# Patient Record
Sex: Male | Born: 1975
Health system: Southern US, Community
[De-identification: ages and names within clinical notes are randomized; demographics above are authoritative.]

## PROBLEM LIST (undated history)

## (undated) DIAGNOSIS — K219 Gastro-esophageal reflux disease without esophagitis: Secondary | ICD-10-CM

## (undated) HISTORY — PX: APPENDECTOMY: SHX54

## (undated) HISTORY — DX: Gastro-esophageal reflux disease without esophagitis: K21.9

---

## 2013-07-06 ENCOUNTER — Ambulatory Visit (INDEPENDENT_AMBULATORY_CARE_PROVIDER_SITE_OTHER): Payer: BC Managed Care – PPO | Admitting: Family Medicine

## 2013-07-06 ENCOUNTER — Encounter: Payer: Self-pay | Admitting: Family Medicine

## 2013-07-06 VITALS — BP 128/79 | HR 59 | Temp 98.1°F | Ht 68.0 in | Wt 207.8 lb

## 2013-07-06 DIAGNOSIS — Z139 Encounter for screening, unspecified: Secondary | ICD-10-CM

## 2013-07-06 DIAGNOSIS — Z23 Encounter for immunization: Secondary | ICD-10-CM

## 2013-07-06 DIAGNOSIS — R5383 Other fatigue: Secondary | ICD-10-CM

## 2013-07-06 DIAGNOSIS — Z0000 Encounter for general adult medical examination without abnormal findings: Secondary | ICD-10-CM

## 2013-07-06 DIAGNOSIS — R5381 Other malaise: Secondary | ICD-10-CM

## 2013-07-06 LAB — POCT CBC
Granulocyte percent: 62.7 %G (ref 37–80)
HCT, POC: 47.6 % (ref 43.5–53.7)
Hemoglobin: 15.8 g/dL (ref 14.1–18.1)
Lymph, poc: 2.5 (ref 0.6–3.4)
MCH, POC: 28.7 pg (ref 27–31.2)
MCHC: 33.2 g/dL (ref 31.8–35.4)
MCV: 86.4 fL (ref 80–97)
MPV: 7.7 fL (ref 0–99.8)
POC Granulocyte: 4.5 (ref 2–6.9)
POC LYMPH PERCENT: 34.2 %L (ref 10–50)
Platelet Count, POC: 188 10*3/uL (ref 142–424)
RBC: 5.5 M/uL (ref 4.69–6.13)
RDW, POC: 13 %
WBC: 7.2 10*3/uL (ref 4.6–10.2)

## 2013-07-06 NOTE — Progress Notes (Signed)
   Subjective:    Patient ID: Duane Murphy., male    DOB: 26-Jun-1976, 37 y.o.   MRN: [id number]  HPI  This 37 y.o. male presents for evaluation of CPE.  He has no acute medical concerns.  Review of Systems    No chest pain, SOB, HA, dizziness, vision change, N/V, diarrhea, constipation, dysuria, urinary urgency or frequency, myalgias, arthralgias or rash.  Objective:   Physical Exam Vital signs noted  Well developed well nourished male.  HEENT - Head atraumatic Normocephalic                Eyes - PERRLA, Conjuctiva - clear Sclera- Clear EOMI                Ears - EAC's Wnl TM's Wnl Gross Hearing WNL                Nose - Nares patent                 Throat - oropharanx wnl Respiratory - Lungs CTA bilateral Cardiac - RRR S1 and S2 without murmur GI - Abdomen soft Nontender and bowel sounds active x 4 Extremities - No edema. Neuro - Grossly intact.       Assessment & Plan:  Fatigue - Plan: POCT CBC, Thyroid Panel With TSH, Vit D  25 hydroxy (rtn osteoporosis monitoring)  Screening - Plan: POCT CBC, CMP14+EGFR, Lipid panel, Thyroid Panel With TSH, Vit D  25 hydroxy (rtn osteoporosis monitoring)  Need for prophylactic vaccination and inoculation against influenza  Deatra Canter FNP

## 2013-07-09 ENCOUNTER — Other Ambulatory Visit: Payer: Self-pay | Admitting: Family Medicine

## 2013-07-09 DIAGNOSIS — E785 Hyperlipidemia, unspecified: Secondary | ICD-10-CM

## 2013-07-09 LAB — CMP14+EGFR
ALT: 88 IU/L — ABNORMAL HIGH (ref 0–44)
AST: 50 IU/L — ABNORMAL HIGH (ref 0–40)
Albumin/Globulin Ratio: 1.4 (ref 1.1–2.5)
Albumin: 4.7 g/dL (ref 3.5–5.5)
Alkaline Phosphatase: 91 IU/L (ref 39–117)
BUN/Creatinine Ratio: 15 (ref 8–19)
BUN: 12 mg/dL (ref 6–20)
CO2: 16 mmol/L — ABNORMAL LOW (ref 18–29)
Calcium: 10.2 mg/dL (ref 8.7–10.2)
Chloride: 106 mmol/L (ref 97–108)
Creatinine, Ser: 0.82 mg/dL (ref 0.76–1.27)
GFR calc Af Amer: 132 mL/min/{1.73_m2} (ref >59)
GFR calc non Af Amer: 114 mL/min/{1.73_m2} (ref >59)
Globulin, Total: 3.4 g/dL (ref 1.5–4.5)
Glucose: 98 mg/dL (ref 65–99)
Potassium: 4.6 mmol/L (ref 3.5–5.2)
Sodium: 147 mmol/L — ABNORMAL HIGH (ref 134–144)
Total Bilirubin: 0.3 mg/dL (ref 0.0–1.2)
Total Protein: 8.1 g/dL (ref 6.0–8.5)

## 2013-07-09 LAB — LIPID PANEL
Chol/HDL Ratio: 6.6 ratio units — ABNORMAL HIGH (ref 0.0–5.0)
Cholesterol, Total: 251 mg/dL — ABNORMAL HIGH (ref 100–199)
HDL: 38 mg/dL — ABNORMAL LOW (ref >39)
LDL Calculated: 154 mg/dL — ABNORMAL HIGH (ref 0–99)
Triglycerides: 295 mg/dL — ABNORMAL HIGH (ref 0–149)
VLDL Cholesterol Cal: 59 mg/dL — ABNORMAL HIGH (ref 5–40)

## 2013-07-09 LAB — THYROID PANEL WITH TSH
Free Thyroxine Index: 2.6 (ref 1.2–4.9)
T3 Uptake Ratio: 32 % (ref 24–39)
T4, Total: 8.2 ug/dL (ref 4.5–12.0)
TSH: 1.12 u[IU]/mL (ref 0.450–4.500)

## 2013-07-09 LAB — VITAMIN D 25 HYDROXY (VIT D DEFICIENCY, FRACTURES): Vit D, 25-Hydroxy: 13.2 ng/mL — ABNORMAL LOW (ref 30.0–100.0)

## 2013-07-09 MED ORDER — PRAVASTATIN SODIUM 20 MG PO TABS: 20.0000 mg | ORAL_TABLET | Freq: Every day | ORAL | Status: DC

## 2013-07-09 MED ORDER — VITAMIN D (ERGOCALCIFEROL) 1.25 MG (50000 UNIT) PO CAPS: 50000.0000 [IU] | ORAL_CAPSULE | ORAL | Status: DC

## 2013-07-30 ENCOUNTER — Ambulatory Visit (INDEPENDENT_AMBULATORY_CARE_PROVIDER_SITE_OTHER): Payer: BC Managed Care – PPO | Admitting: Nurse Practitioner

## 2013-07-30 VITALS — BP 134/85 | HR 65 | Temp 98.1°F | Ht 68.0 in | Wt 210.0 lb

## 2013-07-30 DIAGNOSIS — J069 Acute upper respiratory infection, unspecified: Secondary | ICD-10-CM

## 2013-07-30 DIAGNOSIS — J029 Acute pharyngitis, unspecified: Secondary | ICD-10-CM

## 2013-07-30 LAB — POCT RAPID STREP A (OFFICE): RAPID STREP A SCREEN: NEGATIVE

## 2013-07-30 NOTE — Progress Notes (Signed)
   Subjective:    Patient ID: Duane MillerMacario Thomason Jr., male    DOB: 1976-03-15, 38 y.o.   MRN: [id number]  Sore Throat  Associated symptoms include congestion and coughing. Pertinent negatives include no ear pain or trouble swallowing.  Sinus Problem Associated symptoms include congestion, coughing, sinus pressure, sneezing and a sore throat. Pertinent negatives include no chills or ear pain.   Patient in today c/o sore throat- no fever- started with runny nose and cough last week- slight achiness.    Review of Systems  Constitutional: Negative for fever, chills and fatigue.  HENT: Positive for congestion, sinus pressure, sneezing and sore throat. Negative for ear pain and trouble swallowing.   Respiratory: Positive for cough.        Objective:   Physical Exam  Constitutional: He appears well-developed and well-nourished.  HENT:  Right Ear: Hearing, tympanic membrane, external ear and ear canal normal.  Left Ear: Hearing, tympanic membrane, external ear and ear canal normal.  Nose: Mucosal edema and rhinorrhea present. Right sinus exhibits no maxillary sinus tenderness and no frontal sinus tenderness. Left sinus exhibits no maxillary sinus tenderness and no frontal sinus tenderness.  Mouth/Throat: Uvula is midline. Posterior oropharyngeal erythema (mild) present.  Cardiovascular: Normal rate and normal heart sounds.   Pulmonary/Chest: Effort normal and breath sounds normal. No respiratory distress. He has no wheezes. He has no rales.   Results for orders placed in visit on 07/30/13  POCT RAPID STREP A (OFFICE)      Result Value Range   Rapid Strep A Screen Negative  Negative          Assessment & Plan:   1. Acute pharyngitis   2. Acute upper respiratory infections of unspecified site    1. Take meds as prescribed 2. Use a cool mist humidifier especially during the winter months and when heat has  been humid. 3. Use saline nose sprays frequently 4. Saline irrigations of the  nose can be very helpful if done frequently.  * 4X daily for 1 week*  * Use of a nettie pot can be helpful with this. Follow directions with this* 5. Drink plenty of fluids 6. Keep thermostat turn down low 7.For any cough or congestion  Use plain Mucinex- regular strength or max strength is fine   * Children- consult with Pharmacist for dosing 8. For fever or aces or pains- take tylenol or ibuprofen appropriate for age and weight.  * for fevers greater than 101 orally you may alternate ibuprofen and tylenol every  3 hours.   Mary-Margaret Daphine DeutscherMartin, FNP

## 2013-07-30 NOTE — Patient Instructions (Signed)

## 2013-10-29 ENCOUNTER — Encounter: Payer: Self-pay | Admitting: Family Medicine

## 2013-10-29 ENCOUNTER — Ambulatory Visit (INDEPENDENT_AMBULATORY_CARE_PROVIDER_SITE_OTHER): Payer: BC Managed Care – PPO | Admitting: Family Medicine

## 2013-10-29 VITALS — BP 131/83 | HR 121 | Temp 102.6°F | Ht 68.0 in | Wt 213.4 lb

## 2013-10-29 DIAGNOSIS — K529 Noninfective gastroenteritis and colitis, unspecified: Secondary | ICD-10-CM

## 2013-10-29 DIAGNOSIS — R509 Fever, unspecified: Secondary | ICD-10-CM

## 2013-10-29 DIAGNOSIS — K5289 Other specified noninfective gastroenteritis and colitis: Secondary | ICD-10-CM

## 2013-10-29 DIAGNOSIS — R1013 Epigastric pain: Secondary | ICD-10-CM

## 2013-10-29 LAB — POCT CBC
Granulocyte percent: 93.2 %G — AB (ref 37–80)
HCT, POC: 51.6 % (ref 43.5–53.7)
Hemoglobin: 15.9 g/dL (ref 14.1–18.1)
Lymph, poc: 0.5 — AB (ref 0.6–3.4)
MCH, POC: 26.8 pg — AB (ref 27–31.2)
MCHC: 30.9 g/dL — AB (ref 31.8–35.4)
MCV: 86.9 fL (ref 80–97)
MPV: 7.2 fL (ref 0–99.8)
POC Granulocyte: 9.7 — AB (ref 2–6.9)
POC LYMPH PERCENT: 5 %L — AB (ref 10–50)
Platelet Count, POC: 191 10*3/uL (ref 142–424)
RBC: 5.9 M/uL (ref 4.69–6.13)
RDW, POC: 12.7 %
WBC: 10.4 10*3/uL — AB (ref 4.6–10.2)

## 2013-10-29 LAB — POCT INFLUENZA A/B
Influenza A, POC: NEGATIVE
Influenza B, POC: NEGATIVE

## 2013-10-29 MED ORDER — ONDANSETRON HCL 4 MG PO TABS: 4.0000 mg | ORAL_TABLET | Freq: Three times a day (TID) | ORAL | Status: DC | PRN

## 2013-10-29 MED ORDER — OSELTAMIVIR PHOSPHATE 75 MG PO CAPS: 75.0000 mg | ORAL_CAPSULE | Freq: Two times a day (BID) | ORAL | Status: DC

## 2013-10-29 NOTE — Progress Notes (Signed)
Patient ID: Duane Murphy., male   DOB: 24-Dec-1975, 38 y.o.   MRN: [id number] SUBJECTIVE: CC: Chief Complaint  Patient presents with  . Acute Visit    fever aching nauseous no vomiting     HPI: Has had epigastric pain nausea and diarrhea since yesterday. Body aches terrible. Started after meals yesterday. hasn"t vomited but terribly sick to the stomach. Wife was sick couple of weeks ago with diarrhea. And vomiting , no one else at home sick.    Past Medical History  Diagnosis Date  . GERD (gastroesophageal reflux disease)    Past Surgical History  Procedure Laterality Date  . Appendectomy     History   Social History  . Marital Status: Married    Spouse Name: N/A    Number of Children: N/A  . Years of Education: N/A   Occupational History  . Not on file.   Social History Main Topics  . Smoking status: Former Smoker -- 0.25 packs/day    Types: Cigarettes    Start date: 08/04/1997    Quit date: 07/07/2011  . Smokeless tobacco: Not on file  . Alcohol Use: Yes     Comment: rare  . Drug Use: No  . Sexual Activity: Not on file   Other Topics Concern  . Not on file   Social History Narrative  . No narrative on file   Family History  Problem Relation Age of Onset  . Diabetes Mother   . Hyperlipidemia Mother   . Hypertension Mother   . Diabetes Father   . Hyperlipidemia Father   . Hypertension Father    Current Outpatient Prescriptions on File Prior to Visit  Medication Sig Dispense Refill  . pravastatin (PRAVACHOL) 20 MG tablet Take 1 tablet (20 mg total) by mouth daily.  90 tablet  3  . Vitamin D, Ergocalciferol, (DRISDOL) 50000 UNITS CAPS capsule Take 1 capsule (50,000 Units total) by mouth every 7 (seven) days.  18 capsule  0   No current facility-administered medications on file prior to visit.   No Known Allergies Immunization History  Administered Date(s) Administered  . Influenza,inj,Quad PF,36+ Mos 07/06/2013   Prior to Admission medications    Medication Sig Start Date End Date Taking? Authorizing Provider  pravastatin (PRAVACHOL) 20 MG tablet Take 1 tablet (20 mg total) by mouth daily. 07/09/13   Lysbeth Penner, FNP  Vitamin D, Ergocalciferol, (DRISDOL) 50000 UNITS CAPS capsule Take 1 capsule (50,000 Units total) by mouth every 7 (seven) days. 07/09/13   Lysbeth Penner, FNP     ROS: As above in the HPI. All other systems are stable or negative.  OBJECTIVE: APPEARANCE:  Patient in no acute distress.The patient appeared well nourished and normally developed. Acyanotic. Waist: VITAL SIGNS:BP 131/83  Pulse 121  Temp(Src) 102.6 F (39.2 C) (Oral)  Ht '5\' 8"'  (1.727 m)  Wt 213 lb 6.4 oz (96.798 kg)  BMI 32.46 kg/m2 Febrile Hispanic male.   SKIN: warm and  Dry without overt rashes, tattoos and scars  HEAD and Neck: without JVD, Head and scalp: normal Eyes:No scleral icterus. Fundi normal, eye movements normal. Ears: Auricle normal, canal normal, Tympanic membranes normal, insufflation normal. Nose: normal Throat: normal Neck & thyroid: normal  CHEST & LUNGS: Chest wall: normal Lungs: Clear  CVS: Reveals the PMI to be normally located. Regular rhythm, First and Second Heart sounds are normal,  absence of murmurs, rubs or gallops. Peripheral vasculature: Radial pulses: normal Dorsal pedis pulses: normal Posterior pulses: normal  ABDOMEN:  Appearance:soft epigastric tenderness. Appendectomy scar. bs increasedBenign, no organomegaly, no masses, no Abdominal Aortic enlargement. No Guarding , no rebound. No Bruits. Bowel sounds: increased  RECTAL: N/A GU: N/A  EXTREMETIES: nonedematous.  MUSCULOSKELETAL:  Spine: normal Joints: intact  NEUROLOGIC: oriented to time,place and person; nonfocal. Strength is normal Sensory is normal Reflexes are normal Cranial Nerves are normal.  ASSESSMENT: Fever - Plan: POCT Influenza A/B, POCT CBC, oseltamivir (TAMIFLU) 75 MG capsule  Abdominal pain, epigastric  - Plan: Amylase, Lipase, CMP14+EGFR  Gastroenteritis - Plan: ondansetron (ZOFRAN) 4 MG tablet  PLAN: If acutely worse to the ED  Use OTC imodium-AD  Handout on GE.   Orders Placed This Encounter  Procedures  . Amylase  . Lipase  . CMP14+EGFR  . POCT Influenza A/B  . POCT CBC    Meds ordered this encounter  Medications  . oseltamivir (TAMIFLU) 75 MG capsule    Sig: Take 1 capsule (75 mg total) by mouth 2 (two) times daily.    Dispense:  10 capsule    Refill:  0  . ondansetron (ZOFRAN) 4 MG tablet    Sig: Take 1 tablet (4 mg total) by mouth every 8 (eight) hours as needed for nausea or vomiting.    Dispense:  8 tablet    Refill:  0   Results for orders placed in visit on 10/29/13  POCT INFLUENZA A/B      Result Value Ref Range   Influenza A, POC Negative     Influenza B, POC Negative    POCT CBC      Result Value Ref Range   WBC 10.4 (*) 4.6 - 10.2 K/uL   Lymph, poc 0.5 (*) 0.6 - 3.4   POC LYMPH PERCENT 5.0 (*) 10 - 50 %L   POC Granulocyte 9.7 (*) 2 - 6.9   Granulocyte percent 93.2 (*) 37 - 80 %G   RBC 5.9  4.69 - 6.13 M/uL   Hemoglobin 15.9  14.1 - 18.1 g/dL   HCT, POC 51.6  43.5 - 53.7 %   MCV 86.9  80 - 97 fL   MCH, POC 26.8 (*) 27 - 31.2 pg   MCHC 30.9 (*) 31.8 - 35.4 g/dL   RDW, POC 12.7     Platelet Count, POC 191.0  142 - 424 K/uL   MPV 7.2  0 - 99.8 fL    There are no discontinued medications. Return in about 1 day (around 10/30/2013) for Recheck medical problems.  Marvis Saefong P. Jacelyn Grip, M.D.

## 2013-10-29 NOTE — Patient Instructions (Signed)
USE IMODIUM -AD. Over the counter.     Viral Gastroenteritis Viral gastroenteritis is also known as stomach flu. This condition affects the stomach and intestinal tract. It can cause sudden diarrhea and vomiting. The illness typically lasts 3 to 8 days. Most people develop an immune response that eventually gets rid of the virus. While this natural response develops, the virus can make you quite ill. CAUSES  Many different viruses can cause gastroenteritis, such as rotavirus or noroviruses. You can catch one of these viruses by consuming contaminated food or water. You may also catch a virus by sharing utensils or other personal items with an infected person or by touching a contaminated surface. SYMPTOMS  The most common symptoms are diarrhea and vomiting. These problems can cause a severe loss of body fluids (dehydration) and a body salt (electrolyte) imbalance. Other symptoms may include:  Fever.  Headache.  Fatigue.  Abdominal pain. DIAGNOSIS  Your caregiver can usually diagnose viral gastroenteritis based on your symptoms and a physical exam. A stool sample may also be taken to test for the presence of viruses or other infections. TREATMENT  This illness typically goes away on its own. Treatments are aimed at rehydration. The most serious cases of viral gastroenteritis involve vomiting so severely that you are not able to keep fluids down. In these cases, fluids must be given through an intravenous line (IV). HOME CARE INSTRUCTIONS   Drink enough fluids to keep your urine clear or pale yellow. Drink small amounts of fluids frequently and increase the amounts as tolerated.  Ask your caregiver for specific rehydration instructions.  Avoid:  Foods high in sugar.  Alcohol.  Carbonated drinks.  Tobacco.  Juice.  Caffeine drinks.  Extremely hot or cold fluids.  Fatty, greasy foods.  Too much intake of anything at one time.  Dairy products until 24 to 48 hours after  diarrhea stops.  You may consume probiotics. Probiotics are active cultures of beneficial bacteria. They may lessen the amount and number of diarrheal stools in adults. Probiotics can be found in yogurt with active cultures and in supplements.  Wash your hands well to avoid spreading the virus.  Only take over-the-counter or prescription medicines for pain, discomfort, or fever as directed by your caregiver. Do not give aspirin to children. Antidiarrheal medicines are not recommended.  Ask your caregiver if you should continue to take your regular prescribed and over-the-counter medicines.  Keep all follow-up appointments as directed by your caregiver. SEEK IMMEDIATE MEDICAL CARE IF:   You are unable to keep fluids down.  You do not urinate at least once every 6 to 8 hours.  You develop shortness of breath.  You notice blood in your stool or vomit. This may look like coffee grounds.  You have abdominal pain that increases or is concentrated in one small area (localized).  You have persistent vomiting or diarrhea.  You have a fever.  The patient is a child younger than 3 months, and he or she has a fever.  The patient is a child older than 3 months, and he or she has a fever and persistent symptoms.  The patient is a child older than 3 months, and he or she has a fever and symptoms suddenly get worse.  The patient is a baby, and he or she has no tears when crying. MAKE SURE YOU:   Understand these instructions.  Will watch your condition.  Will get help right away if you are not doing well or get  worse. Document Released: 07/12/2005 Document Revised: 10/04/2011 Document Reviewed: 04/28/2011 [facility] Patient Information 2014 [place], [place].

## 2013-10-30 ENCOUNTER — Ambulatory Visit (INDEPENDENT_AMBULATORY_CARE_PROVIDER_SITE_OTHER): Payer: BC Managed Care – PPO | Admitting: Family Medicine

## 2013-10-30 ENCOUNTER — Encounter: Payer: Self-pay | Admitting: Family Medicine

## 2013-10-30 VITALS — BP 123/81 | HR 85 | Temp 99.5°F | Ht 68.0 in | Wt 212.8 lb

## 2013-10-30 DIAGNOSIS — R6889 Other general symptoms and signs: Secondary | ICD-10-CM

## 2013-10-30 DIAGNOSIS — R899 Unspecified abnormal finding in specimens from other organs, systems and tissues: Secondary | ICD-10-CM

## 2013-10-30 DIAGNOSIS — R1013 Epigastric pain: Secondary | ICD-10-CM

## 2013-10-30 LAB — CMP14+EGFR
ALT: 107 IU/L — ABNORMAL HIGH (ref 0–44)
AST: 56 IU/L — ABNORMAL HIGH (ref 0–40)
Albumin/Globulin Ratio: 1.6 (ref 1.1–2.5)
Albumin: 4.4 g/dL (ref 3.5–5.5)
Alkaline Phosphatase: 98 IU/L (ref 39–117)
BUN/Creatinine Ratio: 14 (ref 8–19)
BUN: 11 mg/dL (ref 6–20)
CO2: 21 mmol/L (ref 18–29)
Calcium: 9.2 mg/dL (ref 8.7–10.2)
Chloride: 96 mmol/L — ABNORMAL LOW (ref 97–108)
Creatinine, Ser: 0.8 mg/dL (ref 0.76–1.27)
GFR calc Af Amer: 132 mL/min/{1.73_m2} (ref >59)
GFR calc non Af Amer: 114 mL/min/{1.73_m2} (ref >59)
Globulin, Total: 2.8 g/dL (ref 1.5–4.5)
Glucose: 110 mg/dL — ABNORMAL HIGH (ref 65–99)
Potassium: 3.8 mmol/L (ref 3.5–5.2)
Sodium: 137 mmol/L (ref 134–144)
Total Bilirubin: 0.5 mg/dL (ref 0.0–1.2)
Total Protein: 7.2 g/dL (ref 6.0–8.5)

## 2013-10-30 LAB — AMYLASE: Amylase: 47 U/L (ref 31–124)

## 2013-10-30 LAB — LIPASE: Lipase: 18 U/L (ref 0–59)

## 2013-10-30 NOTE — Patient Instructions (Signed)
      Dr Xxavier Noon's Recommendations  For nutrition information, I recommend books:  1).Eat to Live by Dr Joel Fuhrman. 2).Prevent and Reverse Heart Disease by Dr Caldwell Esselstyn. 3) Dr Neal Barnard's Book:  Program to Reverse Diabetes  Exercise recommendations are:  If unable to walk, then the patient can exercise in a chair 3 times a day. By flapping arms like a bird gently and raising legs outwards to the front.  If ambulatory, the patient can go for walks for 30 minutes 3 times a week. Then increase the intensity and duration as tolerated.  Goal is to try to attain exercise frequency to 5 times a week.  If applicable: Best to perform resistance exercises (machines or weights) 2 days a week and cardio type exercises 3 days per week.  

## 2013-10-30 NOTE — Progress Notes (Signed)
Patient ID: Duane Murphy., male   DOB: 1976/04/07, 38 y.o.   MRN: [id number] SUBJECTIVE: CC: Chief Complaint  Patient presents with  . Follow-up    1 day follow up feels some better     HPI: Here for follow up of flu like illness with diarrhea and vomiting and fever. However he has had ntermittent episodes prior to this and elevated liver transaminases. He is somewhat better today. Chills has stopped. Fever is less and he has eaten today.  Past Medical History  Diagnosis Date  . GERD (gastroesophageal reflux disease)    Past Surgical History  Procedure Laterality Date  . Appendectomy     History   Social History  . Marital Status: Married    Spouse Name: N/A    Number of Children: N/A  . Years of Education: N/A   Occupational History  . Not on file.   Social History Main Topics  . Smoking status: Former Smoker -- 0.25 packs/day    Types: Cigarettes    Start date: 08/04/1997    Quit date: 07/07/2011  . Smokeless tobacco: Not on file  . Alcohol Use: Yes     Comment: rare  . Drug Use: No  . Sexual Activity: Not on file   Other Topics Concern  . Not on file   Social History Narrative  . No narrative on file   Family History  Problem Relation Age of Onset  . Diabetes Mother   . Hyperlipidemia Mother   . Hypertension Mother   . Diabetes Father   . Hyperlipidemia Father   . Hypertension Father    Current Outpatient Prescriptions on File Prior to Visit  Medication Sig Dispense Refill  . ondansetron (ZOFRAN) 4 MG tablet Take 1 tablet (4 mg total) by mouth every 8 (eight) hours as needed for nausea or vomiting.  8 tablet  0  . oseltamivir (TAMIFLU) 75 MG capsule Take 1 capsule (75 mg total) by mouth 2 (two) times daily.  10 capsule  0  . pravastatin (PRAVACHOL) 20 MG tablet Take 1 tablet (20 mg total) by mouth daily.  90 tablet  3  . Vitamin D, Ergocalciferol, (DRISDOL) 50000 UNITS CAPS capsule Take 1 capsule (50,000 Units total) by mouth every 7 (seven)  days.  18 capsule  0   No current facility-administered medications on file prior to visit.   No Known Allergies Immunization History  Administered Date(s) Administered  . Influenza,inj,Quad PF,36+ Mos 07/06/2013   Prior to Admission medications   Medication Sig Start Date End Date Taking? Authorizing Provider  ondansetron (ZOFRAN) 4 MG tablet Take 1 tablet (4 mg total) by mouth every 8 (eight) hours as needed for nausea or vomiting. 10/29/13  Yes Vernie Shanks, MD  oseltamivir (TAMIFLU) 75 MG capsule Take 1 capsule (75 mg total) by mouth 2 (two) times daily. 10/29/13   Vernie Shanks, MD  pravastatin (PRAVACHOL) 20 MG tablet Take 1 tablet (20 mg total) by mouth daily. 07/09/13   Lysbeth Penner, FNP  Vitamin D, Ergocalciferol, (DRISDOL) 50000 UNITS CAPS capsule Take 1 capsule (50,000 Units total) by mouth every 7 (seven) days. 07/09/13   Lysbeth Penner, FNP     ROS: As above in the HPI. All other systems are stable or negative.  OBJECTIVE: APPEARANCE:  Patient in no acute distress.The patient appeared well nourished and normally developed. Acyanotic. Waist: VITAL SIGNS:BP 123/81  Pulse 85  Temp(Src) 99.5 F (37.5 C) (Oral)  Ht '5\' 8"'  (1.727 m)  Wt 212 lb 12.8 oz (96.525 kg)  BMI 32.36 kg/m2 hispanic Male  SKIN: warm and  Dry without overt rashes, tattoos and scars  HEAD and Neck: without JVD, Head and scalp: normal Eyes:No scleral icterus. Fundi normal, eye movements normal. Ears: Auricle normal, canal normal, Tympanic membranes normal, insufflation normal. Nose: normal Throat: normal Neck & thyroid: normal  CHEST & LUNGS: Chest wall: normal Lungs: Clear  CVS: Reveals the PMI to be normally located. Regular rhythm, First and Second Heart sounds are normal,  absence of murmurs, rubs or gallops. Peripheral vasculature: Radial pulses: normal Dorsal pedis pulses: normal Posterior pulses: normal  ABDOMEN:  Appearance: epigastric tenderness and bloating per  patientmild tenderness to palpation. Benign, no organomegaly, no masses, no Abdominal Aortic enlargement. No Guarding , no rebound. No Bruits. Bowel sounds: normal  RECTAL: N/A GU: N/A  EXTREMETIES: nonedematous.  MUSCULOSKELETAL:  Spine: normal Joints: intact  NEUROLOGIC: oriented to time,place and person; nonfocal. Strength is normal Sensory is normal Reflexes are normal Cranial Nerves are normal. Results for orders placed in visit on 10/29/13  AMYLASE      Result Value Ref Range   Amylase 47  31 - 124 U/L  LIPASE      Result Value Ref Range   Lipase 18  0 - 59 U/L  CMP14+EGFR      Result Value Ref Range   Glucose 110 (*) 65 - 99 mg/dL   BUN 11  6 - 20 mg/dL   Creatinine, Ser 0.80  0.76 - 1.27 mg/dL   GFR calc non Af Amer 114  >59 mL/min/1.73   GFR calc Af Amer 132  >59 mL/min/1.73   BUN/Creatinine Ratio 14  8 - 19   Sodium 137  134 - 144 mmol/L   Potassium 3.8  3.5 - 5.2 mmol/L   Chloride 96 (*) 97 - 108 mmol/L   CO2 21  18 - 29 mmol/L   Calcium 9.2  8.7 - 10.2 mg/dL   Total Protein 7.2  6.0 - 8.5 g/dL   Albumin 4.4  3.5 - 5.5 g/dL   Globulin, Total 2.8  1.5 - 4.5 g/dL   Albumin/Globulin Ratio 1.6  1.1 - 2.5   Total Bilirubin 0.5  0.0 - 1.2 mg/dL   Alkaline Phosphatase 98  39 - 117 IU/L   AST 56 (*) 0 - 40 IU/L   ALT 107 (*) 0 - 44 IU/L  POCT INFLUENZA A/B      Result Value Ref Range   Influenza A, POC Negative     Influenza B, POC Negative    POCT CBC      Result Value Ref Range   WBC 10.4 (*) 4.6 - 10.2 K/uL   Lymph, poc 0.5 (*) 0.6 - 3.4   POC LYMPH PERCENT 5.0 (*) 10 - 50 %L   POC Granulocyte 9.7 (*) 2 - 6.9   Granulocyte percent 93.2 (*) 37 - 80 %G   RBC 5.9  4.69 - 6.13 M/uL   Hemoglobin 15.9  14.1 - 18.1 g/dL   HCT, POC 51.6  43.5 - 53.7 %   MCV 86.9  80 - 97 fL   MCH, POC 26.8 (*) 27 - 31.2 pg   MCHC 30.9 (*) 31.8 - 35.4 g/dL   RDW, POC 12.7     Platelet Count, POC 191.0  142 - 424 K/uL   MPV 7.2  0 - 99.8 fL   ASSESSMENT: Abdominal  pain, epigastric - Plan: US Abdomen Limited RUQ  Abnormal laboratory test - Plan: US Abdomen Limited RUQ Elevated transaminases  PLAN:  Orders Placed This Encounter  Procedures  . US Abdomen Limited RUQ    JH/DEBBIE    BC[employer]    REQ 04/08    TIME OK PER RICH    Standing Status: Future     Number of Occurrences:      Standing Expiration Date: 12/31/2014    Order Specific Question:  Reason for Exam (SYMPTOM  OR DIAGNOSIS REQUIRED)    Answer:  epigastric pain, elevated transaminases    Order Specific Question:  Preferred imaging location?    Answer:  [facility]  continue present regimen. Low fat diet. Await U/S result. Follow up pending labs and U/S. No orders of the defined types were placed in this encounter.   There are no discontinued medications. Return if symptoms worsen or fail to improve.  Revonda Menter P. Jacelyn Grip, M.D.

## 2013-10-31 ENCOUNTER — Ambulatory Visit (HOSPITAL_COMMUNITY)
Admission: RE | Admit: 2013-10-31 | Discharge: 2013-10-31 | Disposition: A | Discharge status: Home / Self Care | Payer: BC Managed Care – PPO | Source: Ambulatory Visit | Attending: Family Medicine | Admitting: Family Medicine

## 2013-10-31 DIAGNOSIS — R899 Unspecified abnormal finding in specimens from other organs, systems and tissues: Secondary | ICD-10-CM

## 2013-10-31 DIAGNOSIS — K7689 Other specified diseases of liver: Secondary | ICD-10-CM | POA: Insufficient documentation

## 2013-10-31 DIAGNOSIS — R109 Unspecified abdominal pain: Secondary | ICD-10-CM | POA: Insufficient documentation

## 2013-10-31 DIAGNOSIS — R1013 Epigastric pain: Secondary | ICD-10-CM

## 2013-10-31 NOTE — Progress Notes (Signed)
Quick Note:  Call patient. GB U/S normal. No change in plan. ______

## 2013-11-01 ENCOUNTER — Telehealth: Payer: Self-pay

## 2013-11-01 NOTE — Telephone Encounter (Signed)
SPOKE TO WIFE --SHE IS AWARE OF ULTRASOUND REPORT AND THEY ARE REQUESTING REFERRAL TO GI SPECIALIST AND ALSO WANTS TO KNOW IF AND WHEN SHOULD START HIS CHOLESTEROL MEDS. Duane Murphy.?

## 2013-11-02 NOTE — Telephone Encounter (Signed)
Patients wife aware will call back at another time to make appointment when they find a good time.

## 2013-11-02 NOTE — Telephone Encounter (Signed)
Follow up in 1 week to recheck the liver enzymes and then we can also do the referral at that time.

## 2013-11-14 ENCOUNTER — Other Ambulatory Visit (INDEPENDENT_AMBULATORY_CARE_PROVIDER_SITE_OTHER): Payer: BC Managed Care – PPO

## 2013-11-14 DIAGNOSIS — R7989 Other specified abnormal findings of blood chemistry: Secondary | ICD-10-CM

## 2013-11-14 DIAGNOSIS — E785 Hyperlipidemia, unspecified: Secondary | ICD-10-CM

## 2013-11-14 NOTE — Progress Notes (Signed)
Patient came in for labs only.

## 2013-11-15 LAB — HEPATIC FUNCTION PANEL
ALT: 67 IU/L — ABNORMAL HIGH (ref 0–44)
AST: 34 IU/L (ref 0–40)
Albumin: 4.7 g/dL (ref 3.5–5.5)
Alkaline Phosphatase: 78 IU/L (ref 39–117)
Bilirubin, Direct: 0.12 mg/dL (ref 0.00–0.40)
Total Bilirubin: 0.4 mg/dL (ref 0.0–1.2)
Total Protein: 7.4 g/dL (ref 6.0–8.5)

## 2013-11-15 LAB — LIPID PANEL
Chol/HDL Ratio: 7.1 ratio units — ABNORMAL HIGH (ref 0.0–5.0)
Cholesterol, Total: 221 mg/dL — ABNORMAL HIGH (ref 100–199)
HDL: 31 mg/dL — ABNORMAL LOW (ref >39)
LDL Calculated: 133 mg/dL — ABNORMAL HIGH (ref 0–99)
Triglycerides: 283 mg/dL — ABNORMAL HIGH (ref 0–149)
VLDL Cholesterol Cal: 57 mg/dL — ABNORMAL HIGH (ref 5–40)

## 2013-11-18 ENCOUNTER — Other Ambulatory Visit: Payer: Self-pay | Admitting: Family Medicine

## 2013-11-18 DIAGNOSIS — E785 Hyperlipidemia, unspecified: Secondary | ICD-10-CM

## 2013-11-18 MED ORDER — ATORVASTATIN CALCIUM 20 MG PO TABS: 20.0000 mg | ORAL_TABLET | Freq: Every day | ORAL | Status: DC

## 2013-11-18 NOTE — Progress Notes (Signed)
Quick Note:  Call Patient Labs are abnormal:but a little better: The liver enzymes are better. One is still high The lipids are a little better but still high.  Recommendations: I will switch from pravastatin to atorvastatin because he may get a better response. Follow up with a provider in 3 months. Needs to be on a Plant based Diet.   ______

## 2014-01-01 ENCOUNTER — Ambulatory Visit (INDEPENDENT_AMBULATORY_CARE_PROVIDER_SITE_OTHER): Payer: BC Managed Care – PPO | Admitting: Family Medicine

## 2014-01-01 ENCOUNTER — Ambulatory Visit (INDEPENDENT_AMBULATORY_CARE_PROVIDER_SITE_OTHER): Payer: BC Managed Care – PPO

## 2014-01-01 ENCOUNTER — Encounter: Payer: Self-pay | Admitting: Family Medicine

## 2014-01-01 VITALS — BP 134/72 | HR 111 | Temp 101.6°F | Ht 69.0 in | Wt 211.0 lb

## 2014-01-01 DIAGNOSIS — K529 Noninfective gastroenteritis and colitis, unspecified: Secondary | ICD-10-CM

## 2014-01-01 DIAGNOSIS — K5289 Other specified noninfective gastroenteritis and colitis: Secondary | ICD-10-CM

## 2014-01-01 DIAGNOSIS — R197 Diarrhea, unspecified: Secondary | ICD-10-CM

## 2014-01-01 DIAGNOSIS — R509 Fever, unspecified: Secondary | ICD-10-CM

## 2014-01-01 DIAGNOSIS — M2550 Pain in unspecified joint: Secondary | ICD-10-CM

## 2014-01-01 DIAGNOSIS — R110 Nausea: Secondary | ICD-10-CM

## 2014-01-01 LAB — POCT CBC
Granulocyte percent: 91.6 %G — AB (ref 37–80)
HCT, POC: 47.6 % (ref 43.5–53.7)
Hemoglobin: 15.2 g/dL (ref 14.1–18.1)
Lymph, poc: 0.8 (ref 0.6–3.4)
MCH, POC: 28 pg (ref 27–31.2)
MCHC: 32 g/dL (ref 31.8–35.4)
MCV: 87.6 fL (ref 80–97)
MPV: 8 fL (ref 0–99.8)
POC Granulocyte: 9.7 — AB (ref 2–6.9)
POC LYMPH PERCENT: 7.2 %L — AB (ref 10–50)
Platelet Count, POC: 159 10*3/uL (ref 142–424)
RBC: 5.4 M/uL (ref 4.69–6.13)
RDW, POC: 13.2 %
WBC: 10.6 10*3/uL — AB (ref 4.6–10.2)

## 2014-01-01 MED ORDER — ONDANSETRON 8 MG PO TBDP: 8.0000 mg | ORAL_TABLET | Freq: Three times a day (TID) | ORAL | Status: DC | PRN

## 2014-01-01 NOTE — Progress Notes (Signed)
   Subjective:    Patient ID: [name]., male    DOB: 08-Mar-1976, 38 y.o.   MRN: [id number]  HPI This 38 y.o. male presents for evaluation of nausea, malaise, diarrhea, and fever.  He states he had similar sx's a couple of months ago and he thinks there is something that is causing this and he wants to have tests to look for possible causes for his N/V/D.  He took the zofran $RemoveBe'4mg'qsdEuhKxy$  tabs and they did not help.   Review of Systems C/o fever, malaise, N/V/D   No chest pain, SOB, HA, dizziness, vision change, constipation, dysuria, urinary urgency or frequency, myalgias, arthralgias or rash.  Objective:   Physical Exam  Vital signs noted  Well developed well nourished male.  HEENT - Head atraumatic Normocephalic                Eyes - PERRLA, Conjuctiva - clear Sclera- Clear EOMI                Ears - EAC's Wnl TM's Wnl Gross Hearing WNL                 Throat - oropharanx wnl Respiratory - Lungs CTA bilateral Cardiac - RRR S1 and S2 without murmur GI - Abdomen soft Nontender and bowel sounds active x 4 Extremities - No edema. Neuro - Grossly intact.  Results for orders placed in visit on 01/01/14  POCT CBC      Result Value Ref Range   WBC 10.6 (*) 4.6 - 10.2 K/uL   Lymph, poc 0.8  0.6 - 3.4   POC LYMPH PERCENT 7.2 (*) 10 - 50 %L   POC Granulocyte 9.7 (*) 2 - 6.9   Granulocyte percent 91.6 (*) 37 - 80 %G   RBC 5.4  4.69 - 6.13 M/uL   Hemoglobin 15.2  14.1 - 18.1 g/dL   HCT, POC 47.6  43.5 - 53.7 %   MCV 87.6  80 - 97 fL   MCH, POC 28.0  27 - 31.2 pg   MCHC 32.0  31.8 - 35.4 g/dL   RDW, POC 13.2     Platelet Count, POC 159.0  142 - 424 K/uL   MPV 8.0  0 - 99.8 fL   KUB - Normal bowel gas pattern Prelimnary reading by Gwyndolyn Saxon Emy Angevine,FNP    Assessment & Plan:  Gastroenteritis - Plan: ondansetron (ZOFRAN ODT) 8 MG disintegrating tablet, POCT CBC, CMP14+EGFR, Sedimentation rate, ANA, Lipase, DG Abd 1 View, Rocky mtn spotted fvr abs pnl(IgG+IgM), Lyme Ab/Western Blot  Reflex  Nausea alone - Plan: ondansetron (ZOFRAN ODT) 8 MG disintegrating tablet, POCT CBC, CMP14+EGFR, Sedimentation rate, ANA, Lipase, DG Abd 1 View, Rocky mtn spotted fvr abs pnl(IgG+IgM), Lyme Ab/Western Blot Reflex  Diarrhea - Plan: ondansetron (ZOFRAN ODT) 8 MG disintegrating tablet, POCT CBC, CMP14+EGFR, Sedimentation rate, ANA, Lipase, DG Abd 1 View, Rocky mtn spotted fvr abs pnl(IgG+IgM), Lyme Ab/Western Blot Reflex  Joint pain - Plan: ondansetron (ZOFRAN ODT) 8 MG disintegrating tablet, POCT CBC, CMP14+EGFR, Sedimentation rate, ANA, Lipase, DG Abd 1 View, Rocky mtn spotted fvr abs pnl(IgG+IgM), Lyme Ab/Western Blot Reflex  Fever, unspecified - Plan: ondansetron (ZOFRAN ODT) 8 MG disintegrating tablet, POCT CBC, CMP14+EGFR, Sedimentation rate, ANA, Lipase, DG Abd 1 View, Rocky mtn spotted fvr abs pnl(IgG+IgM), Lyme Ab/Western Blot Reflex  Follow up in one week.  Lysbeth Penner FNP

## 2014-01-03 LAB — CMP14+EGFR
ALT: 69 IU/L — ABNORMAL HIGH (ref 0–44)
AST: 32 IU/L (ref 0–40)
Albumin/Globulin Ratio: 1.6 (ref 1.1–2.5)
Albumin: 4.4 g/dL (ref 3.5–5.5)
Alkaline Phosphatase: 91 IU/L (ref 39–117)
BUN/Creatinine Ratio: 12 (ref 8–19)
BUN: 11 mg/dL (ref 6–20)
CO2: 24 mmol/L (ref 18–29)
Calcium: 8.9 mg/dL (ref 8.7–10.2)
Chloride: 96 mmol/L — ABNORMAL LOW (ref 97–108)
Creatinine, Ser: 0.9 mg/dL (ref 0.76–1.27)
GFR calc Af Amer: 126 mL/min/{1.73_m2} (ref >59)
GFR calc non Af Amer: 109 mL/min/{1.73_m2} (ref >59)
Globulin, Total: 2.7 g/dL (ref 1.5–4.5)
Glucose: 120 mg/dL — ABNORMAL HIGH (ref 65–99)
Potassium: 4.1 mmol/L (ref 3.5–5.2)
Sodium: 138 mmol/L (ref 134–144)
Total Bilirubin: 0.6 mg/dL (ref 0.0–1.2)
Total Protein: 7.1 g/dL (ref 6.0–8.5)

## 2014-01-03 LAB — LIPASE: Lipase: 19 U/L (ref 0–59)

## 2014-01-03 LAB — SEDIMENTATION RATE: Sed Rate: 6 mm/hr (ref 0–15)

## 2014-01-03 LAB — ROCKY MTN SPOTTED FVR ABS PNL(IGG+IGM)
RMSF IgG: NEGATIVE
RMSF IgM: 0.13 index (ref 0.00–0.89)

## 2014-01-03 LAB — ANA: Anti Nuclear Antibody(ANA): NEGATIVE

## 2014-01-03 LAB — LYME AB/WESTERN BLOT REFLEX
LYME DISEASE AB, QUANT, IGM: <0.8 index (ref 0.00–0.79)
Lyme IgG/IgM Ab: <0.91 {ISR} (ref 0.00–0.90)

## 2014-01-07 ENCOUNTER — Telehealth: Payer: Self-pay | Admitting: Family Medicine

## 2014-01-07 NOTE — Telephone Encounter (Signed)
Message copied by Azalee CourseFULP, ASHLEY on Mon Jan 07, 2014  9:29 AM ------      Message from: Deatra CanterXFORD, WILLIAM J      Created: Fri Jan 04, 2014  6:15 PM       lymes and rmsf are negative and arthritis panel is negative, glucose in non diabetic range but elevated. ------

## 2014-04-17 ENCOUNTER — Telehealth: Payer: Self-pay | Admitting: Family Medicine

## 2014-04-17 NOTE — Telephone Encounter (Signed)
Appt given for tomorrow

## 2014-04-18 ENCOUNTER — Ambulatory Visit (INDEPENDENT_AMBULATORY_CARE_PROVIDER_SITE_OTHER): Payer: BC Managed Care – PPO | Admitting: Family Medicine

## 2014-04-18 ENCOUNTER — Encounter: Payer: Self-pay | Admitting: Family Medicine

## 2014-04-18 VITALS — BP 120/78 | HR 68 | Temp 98.1°F | Ht 69.0 in | Wt 212.0 lb

## 2014-04-18 DIAGNOSIS — M25529 Pain in unspecified elbow: Secondary | ICD-10-CM

## 2014-04-18 DIAGNOSIS — E785 Hyperlipidemia, unspecified: Secondary | ICD-10-CM

## 2014-04-18 MED ORDER — NAPROXEN 500 MG PO TABS: 500.0000 mg | ORAL_TABLET | Freq: Two times a day (BID) | ORAL | Status: DC

## 2014-04-18 MED ORDER — ATORVASTATIN CALCIUM 20 MG PO TABS: 20.0000 mg | ORAL_TABLET | Freq: Every day | ORAL | Status: DC

## 2014-04-18 NOTE — Progress Notes (Signed)
   Subjective:    Patient ID: Duane Murphy., male    DOB: 10/09/1975, 38 y.o.   MRN: [id number]  HPI This 38 y.o. male presents for evaluation of elbow pain.  He needs refill on lipitor.   Review of Systems No chest pain, SOB, HA, dizziness, vision change, N/V, diarrhea, constipation, dysuria, urinary urgency or frequency, myalgias, arthralgias or rash.     Objective:   Physical Exam Vital signs noted  Well developed well nourished male.  HEENT - Head atraumatic Normocephalic Respiratory - Lungs CTA bilateral Cardiac - RRR S1 and S2 without murmur GI - Abdomen soft Nontender and bowel sounds active x 4 Extremities - No edema. Neuro - Grossly intact. MS - Left lateral epicondyle tender      Assessment & Plan:  Elbow pain, unspecified laterality - Plan: naproxen (NAPROSYN) 500 MG tablet  HLD (hyperlipidemia) - Plan: atorvastatin (LIPITOR) 20 MG tablet  Deatra Canter FNP

## 2014-05-23 ENCOUNTER — Ambulatory Visit (INDEPENDENT_AMBULATORY_CARE_PROVIDER_SITE_OTHER): Payer: BC Managed Care – PPO

## 2014-05-23 DIAGNOSIS — Z23 Encounter for immunization: Secondary | ICD-10-CM

## 2014-05-24 ENCOUNTER — Other Ambulatory Visit: Payer: Self-pay | Admitting: Family Medicine

## 2014-10-30 ENCOUNTER — Encounter: Payer: Self-pay | Admitting: Family Medicine

## 2014-10-30 ENCOUNTER — Ambulatory Visit (INDEPENDENT_AMBULATORY_CARE_PROVIDER_SITE_OTHER): Payer: BLUE CROSS/BLUE SHIELD | Admitting: Family Medicine

## 2014-10-30 VITALS — BP 125/77 | HR 76 | Temp 97.1°F | Ht 69.0 in | Wt 214.0 lb

## 2014-10-30 DIAGNOSIS — Z0000 Encounter for general adult medical examination without abnormal findings: Secondary | ICD-10-CM | POA: Diagnosis not present

## 2014-10-30 NOTE — Addendum Note (Signed)
Addended by: Prescott GumLAND, Alethea Terhaar M on: 10/30/2014 09:45 AM   Modules accepted: Kipp BroodSmartSet

## 2014-10-30 NOTE — Progress Notes (Signed)
   Subjective:    Patient ID: Duane MillerMacario Villatoro Jr., male    DOB: 03/08/1976, 39 y.o.   MRN: [id number]  HPI  39 year old gentleman here for a physical. His insurance company has requested physical and as needed for a price break on premiums. He has no current problems or symptoms. He has been told in the past that he has borderline blood sugars as well as cholesterol and was even given a statin at one time although I do not see any lipid numbers to support use of statins. We spent some time talking about importance of diet and exercise and weight but I am not sure he is ready to give up what he likes to eat.  There are no active problems to display for this patient.  Outpatient Encounter Prescriptions as of 10/30/2014  Medication Sig  . [DISCONTINUED] atorvastatin (LIPITOR) 20 MG tablet Take 1 tablet (20 mg total) by mouth daily.  . [DISCONTINUED] naproxen (NAPROSYN) 500 MG tablet TAKE 1 TABLET (500 MG TOTAL) BY MOUTH 2 (TWO) TIMES DAILY WITH A MEAL.     Review of Systems  Constitutional: Negative.   HENT: Negative.   Eyes: Negative.   Respiratory: Negative.  Negative for shortness of breath.   Cardiovascular: Negative.  Negative for chest pain and leg swelling.  Gastrointestinal: Negative.   Genitourinary: Negative.   Musculoskeletal: Negative.   Skin: Negative.   Neurological: Negative.   Psychiatric/Behavioral: Negative.   All other systems reviewed and are negative.      Objective:   Physical Exam  Constitutional: He is oriented to person, place, and time. He appears well-developed and well-nourished.  HENT:  Head: Normocephalic.  Right Ear: External ear normal.  Left Ear: External ear normal.  Nose: Nose normal.  Mouth/Throat: Oropharynx is clear and moist.  Eyes: Conjunctivae and EOM are normal. Pupils are equal, round, and reactive to light.  Neck: Normal range of motion. Neck supple.  Cardiovascular: Normal rate, regular rhythm, normal heart sounds and intact distal  pulses.   Pulmonary/Chest: Effort normal and breath sounds normal.  Abdominal: Soft. Bowel sounds are normal.  Musculoskeletal: Normal range of motion.  Neurological: He is alert and oriented to person, place, and time.  Skin: Skin is warm and dry.  Psychiatric: He has a normal mood and affect. His behavior is normal. Judgment and thought content normal.    BP 125/77 mmHg  Pulse 76  Temp(Src) 97.1 F (36.2 C) (Oral)  Ht 5\' 9"  (1.753 m)  Wt 214 lb (97.07 kg)  BMI 31.59 kg/m2        Assessment & Plan:  1. Wellness examination Normal physical exam. I suspect labs will show borderline sugar and cholesterol numbers again but I doubt he will need treatment unless things are changed a lot in the last year. Also discussed general health measures such as self testicular exams, regular exercise, and no smoking.  Frederica KusterStephen M Miller MD

## 2014-10-31 LAB — CMP14+EGFR
ALT: 68 IU/L — AB (ref 0–44)
AST: 34 IU/L (ref 0–40)
Albumin/Globulin Ratio: 1.4 (ref 1.1–2.5)
Albumin: 4.3 g/dL (ref 3.5–5.5)
Alkaline Phosphatase: 87 IU/L (ref 39–117)
BUN/Creatinine Ratio: 11 (ref 8–19)
BUN: 8 mg/dL (ref 6–20)
Bilirubin Total: 0.3 mg/dL (ref 0.0–1.2)
CALCIUM: 9.5 mg/dL (ref 8.7–10.2)
CHLORIDE: 101 mmol/L (ref 97–108)
CO2: 26 mmol/L (ref 18–29)
Creatinine, Ser: 0.75 mg/dL — ABNORMAL LOW (ref 0.76–1.27)
GFR calc Af Amer: 135 mL/min/{1.73_m2} (ref >59)
GFR calc non Af Amer: 116 mL/min/{1.73_m2} (ref >59)
GLUCOSE: 97 mg/dL (ref 65–99)
Globulin, Total: 3.1 g/dL (ref 1.5–4.5)
POTASSIUM: 4.6 mmol/L (ref 3.5–5.2)
SODIUM: 140 mmol/L (ref 134–144)
TOTAL PROTEIN: 7.4 g/dL (ref 6.0–8.5)

## 2014-10-31 LAB — LIPID PANEL
Chol/HDL Ratio: 6.1 ratio units — ABNORMAL HIGH (ref 0.0–5.0)
Cholesterol, Total: 201 mg/dL — ABNORMAL HIGH (ref 100–199)
HDL: 33 mg/dL — AB (ref >39)
LDL CALC: 106 mg/dL — AB (ref 0–99)
TRIGLYCERIDES: 312 mg/dL — AB (ref 0–149)
VLDL CHOLESTEROL CAL: 62 mg/dL — AB (ref 5–40)

## 2014-11-01 ENCOUNTER — Telehealth: Payer: Self-pay | Admitting: *Deleted

## 2014-11-01 NOTE — Telephone Encounter (Signed)
-----   Message from Frederica KusterStephen M Miller, MD sent at 11/01/2014 11:52 AM EDT ----- Lipids are okay but triglycerides are up, so needs to watch fats and lose weight; may try fish oil also; chemistry panel is normal

## 2014-12-25 IMAGING — US US ABDOMEN LIMITED
1 series · 14 of 25 positions shown · non-contrast
Comparison: None.

CLINICAL DATA: Mid and upper abdominal pain.

EXAM:
US ABDOMEN LIMITED - RIGHT UPPER QUADRANT

[Series 1: us abdomen limited · 0.20mm/px · 14 of 40 slices shown]
[im 1/40]
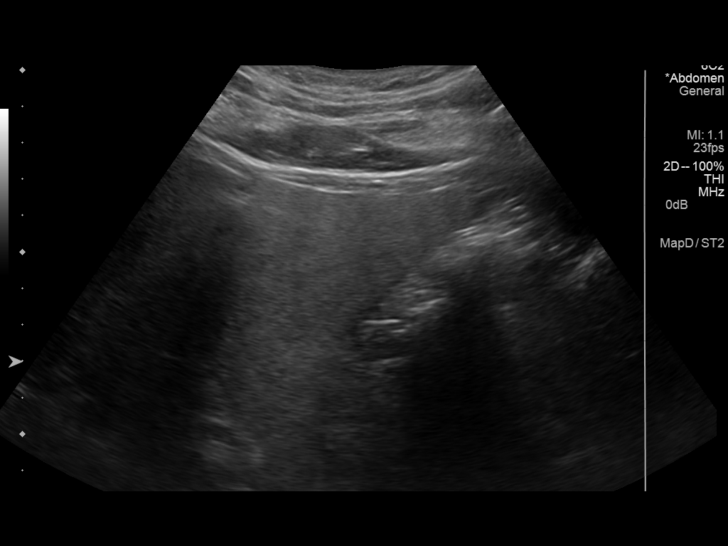
[im 4/40]
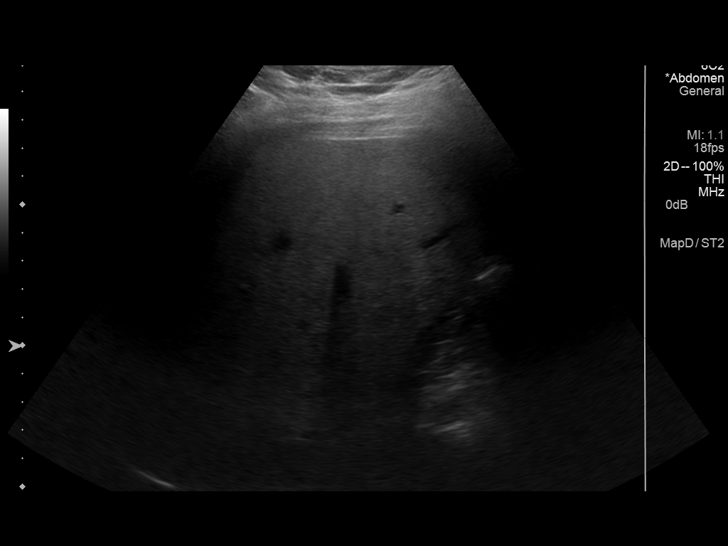
[im 7/40]
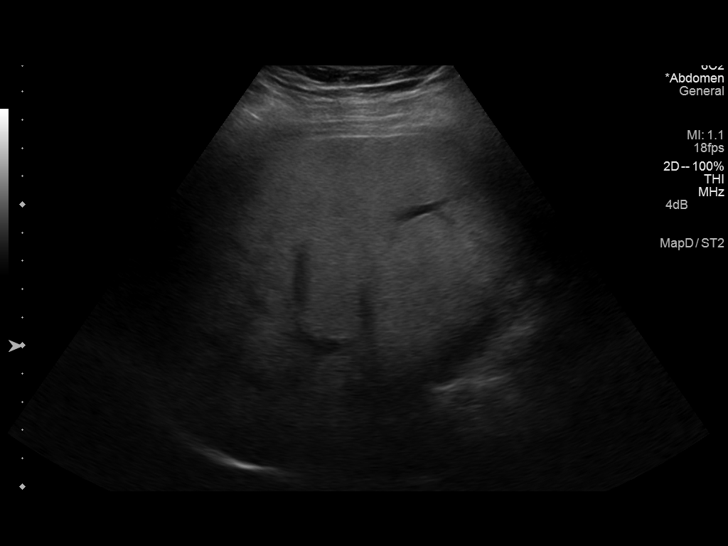
[im 10/40]
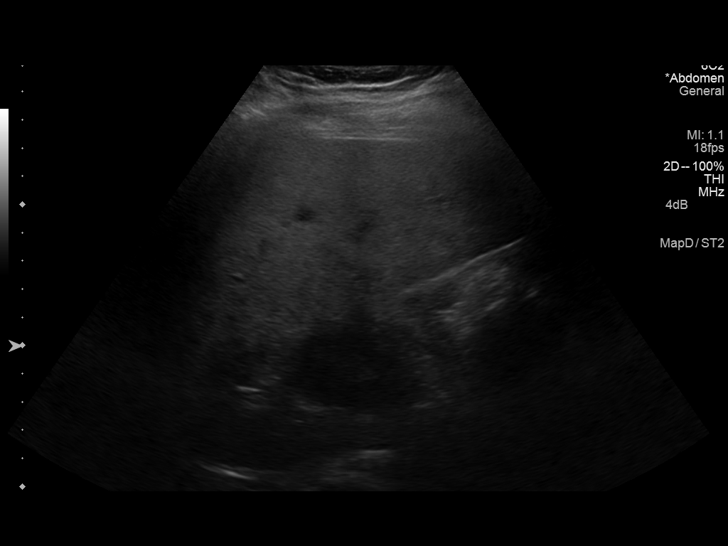
[im 14/40]
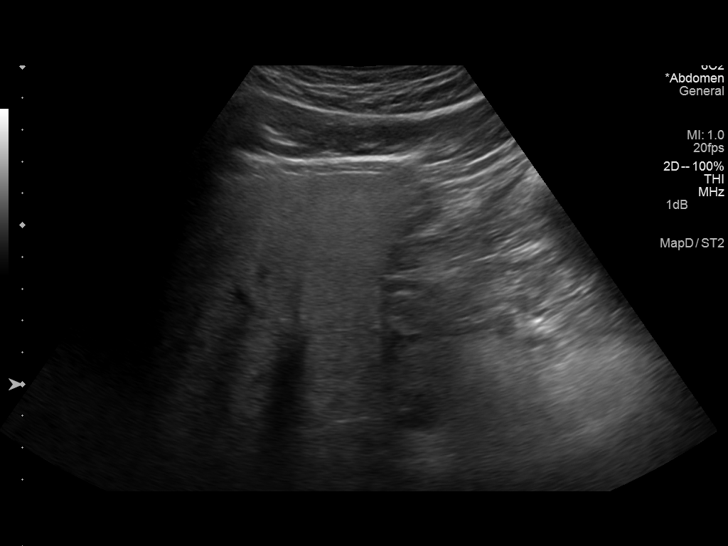
[im 15/40]
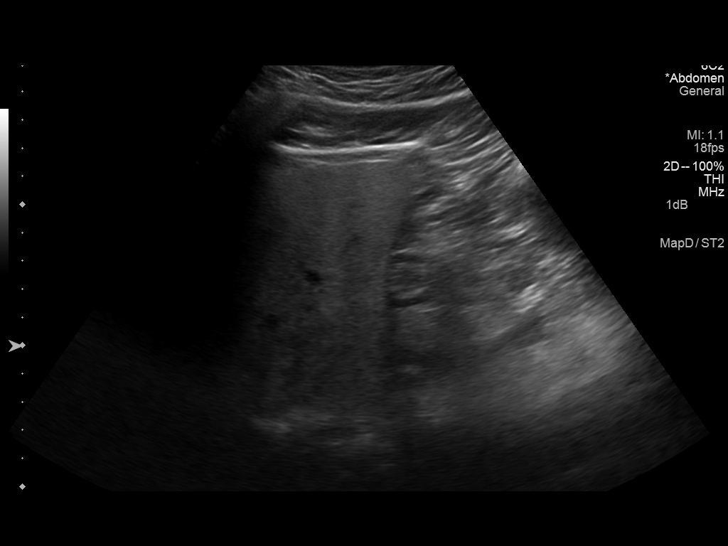
[im 18/40]
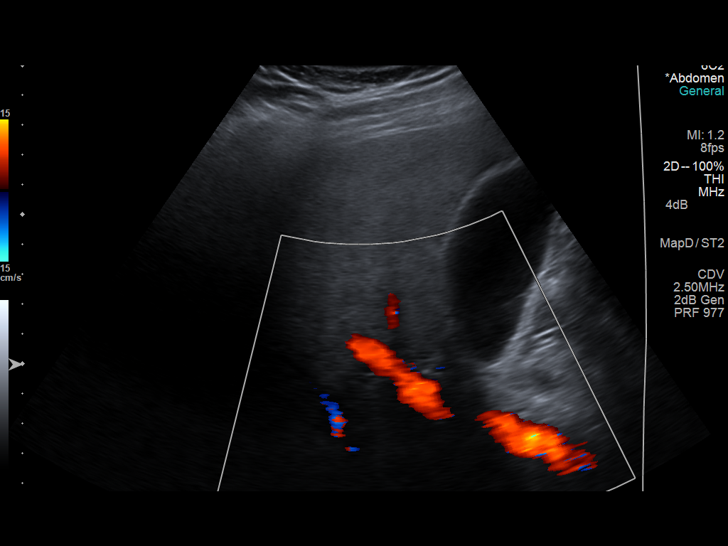
[im 22/40]
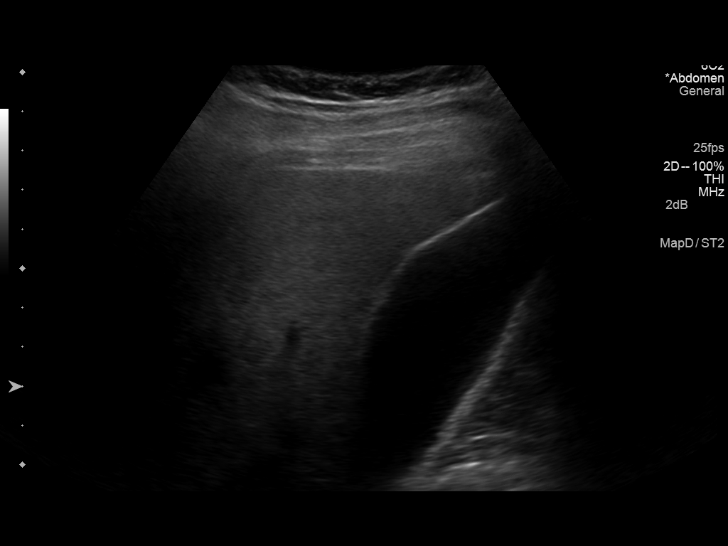
[im 25/40]
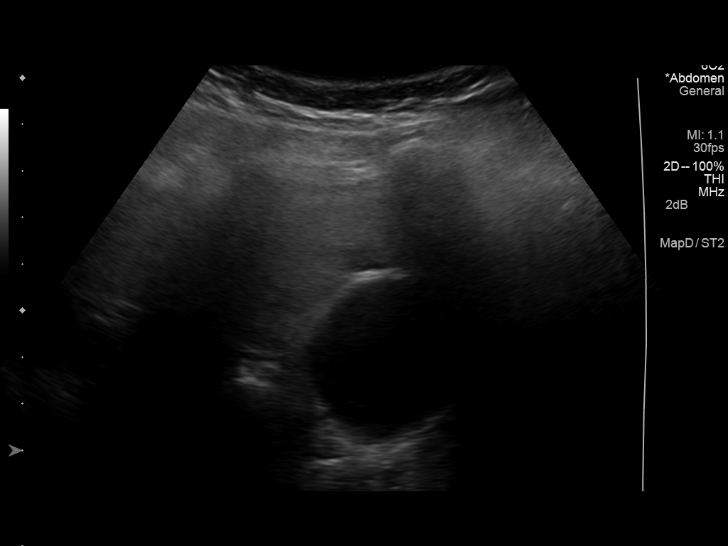
[im 27/40]
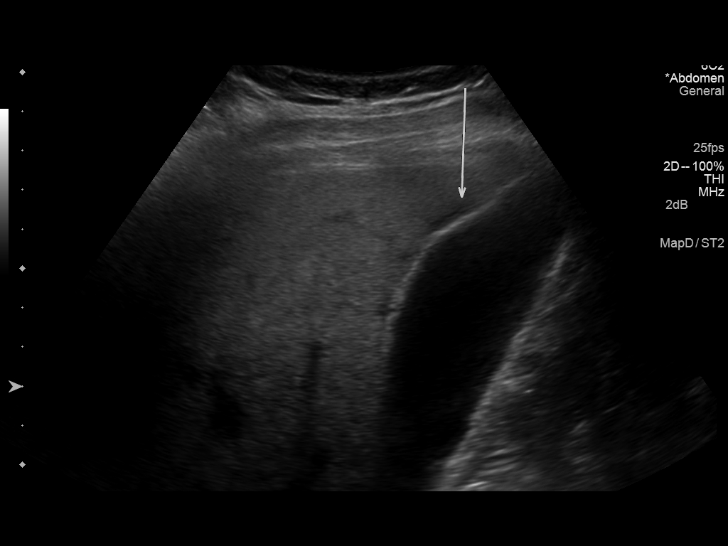
[im 30/40]
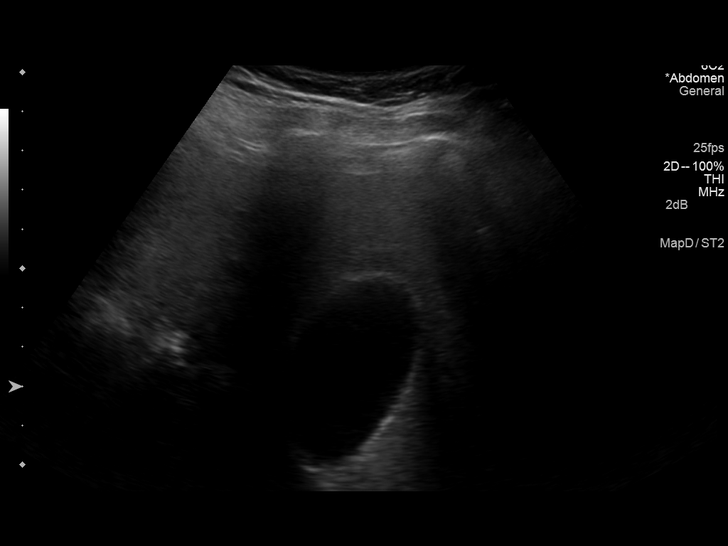
[im 33/40]
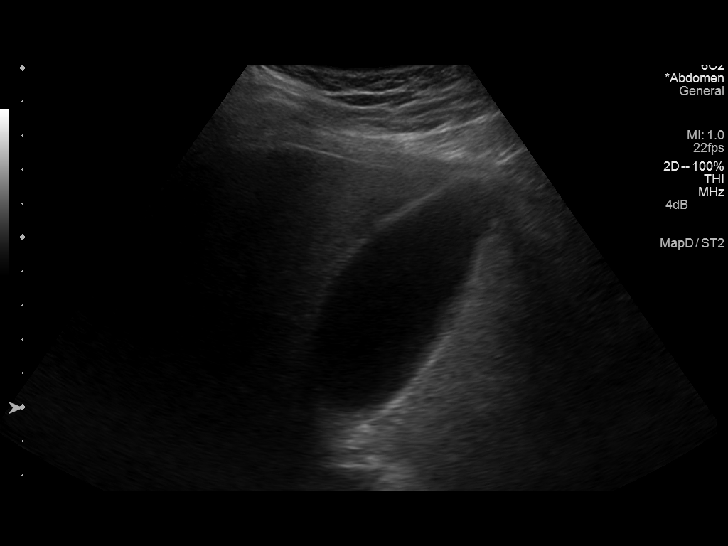
[im 36/40]
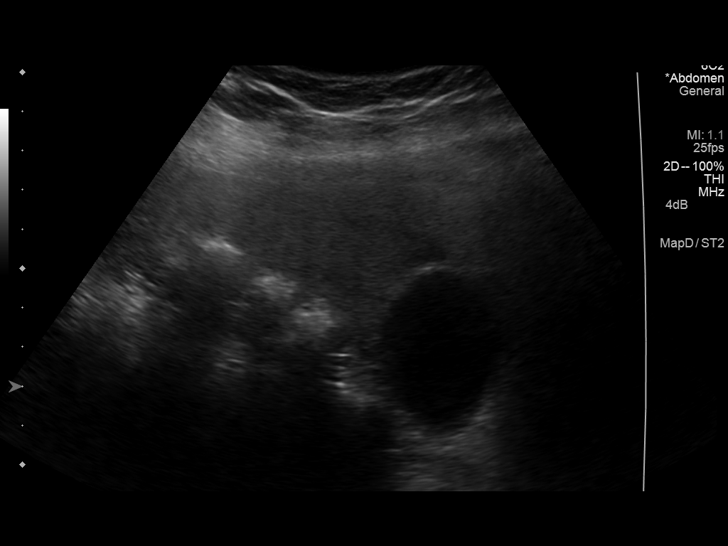
[im 40/40]
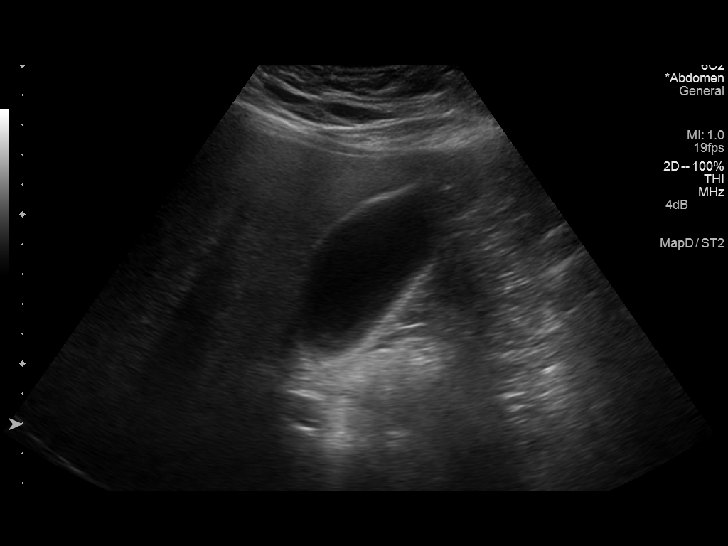

[14 of 25 positions shown; findings below may reference images not displayed]

FINDINGS: Gallbladder:

No gallstones or wall thickening visualized. No sonographic Murphy
sign noted.

Common bile duct:

Diameter: 0.4 cm

Liver:

Demonstrates increased echogenicity consistent with fatty
infiltration. No focal lesion or intrahepatic biliary ductal
dilatation.
IMPRESSION: No acute finding.  Normal appearing gallbladder.

Fatty infiltration liver.

## 2015-02-25 IMAGING — CR DG ABDOMEN 1V
1 series · 1 of 1 positions shown · non-contrast
Comparison: None.

CLINICAL DATA: Abdominal pain.  Fever.  Fatigue.

EXAM:
ABDOMEN - 1 VIEW

[view not recorded]
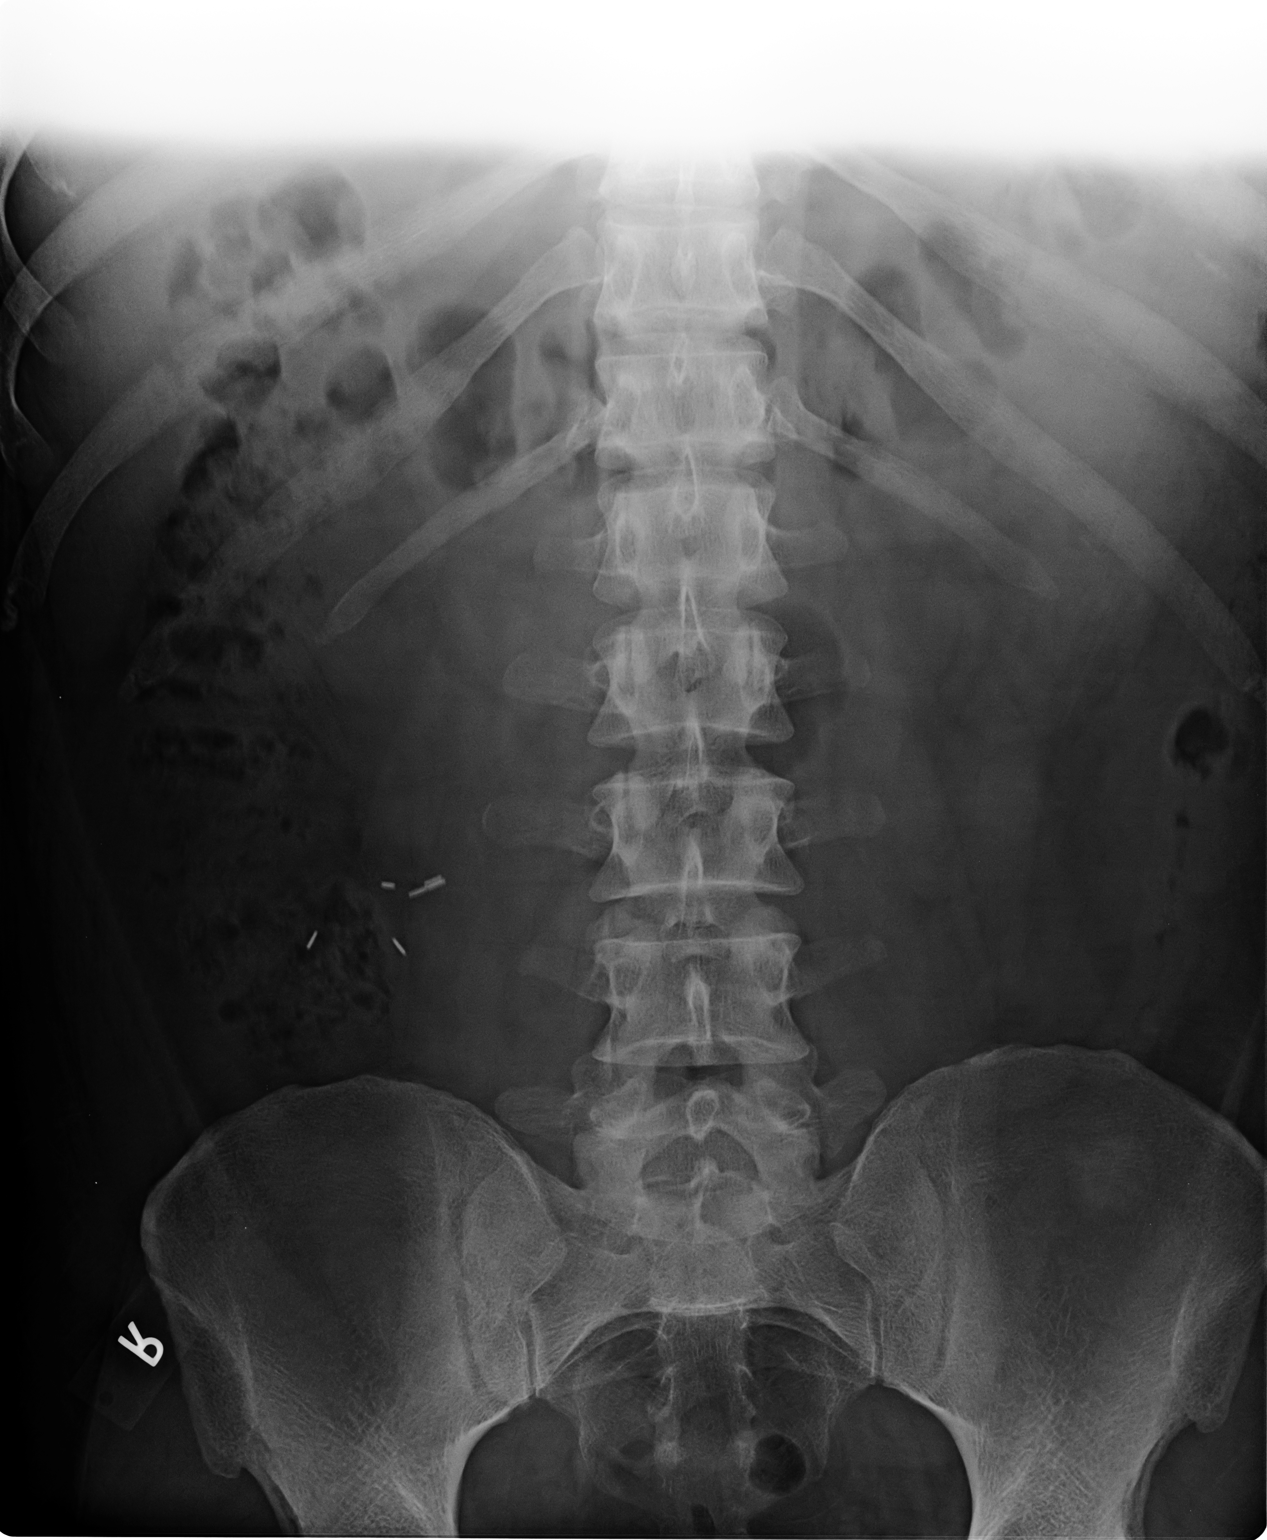

[1 of 1 positions shown; findings below may reference images not displayed]

FINDINGS: Surgical clips are present in the right mid abdomen. Bowel gas
pattern appears within normal limits. Nonobstructive bowel gas
pattern. Hemidiaphragms not visualized. Rectum also not visualized.
IMPRESSION: No acute abnormality.  Visualize abdomen appears normal.

## 2015-07-31 ENCOUNTER — Ambulatory Visit: Payer: BLUE CROSS/BLUE SHIELD | Admitting: Family Medicine

## 2015-08-01 ENCOUNTER — Encounter: Payer: Self-pay | Admitting: Family Medicine

## 2015-10-10 ENCOUNTER — Ambulatory Visit (INDEPENDENT_AMBULATORY_CARE_PROVIDER_SITE_OTHER): Payer: BLUE CROSS/BLUE SHIELD | Admitting: Family Medicine

## 2015-10-10 ENCOUNTER — Encounter: Payer: Self-pay | Admitting: Family Medicine

## 2015-10-10 VITALS — BP 118/81 | HR 74 | Temp 97.3°F | Ht 69.0 in | Wt 210.0 lb

## 2015-10-10 DIAGNOSIS — Z0000 Encounter for general adult medical examination without abnormal findings: Secondary | ICD-10-CM

## 2015-10-10 DIAGNOSIS — E785 Hyperlipidemia, unspecified: Secondary | ICD-10-CM

## 2015-10-10 NOTE — Progress Notes (Signed)
   Subjective:    Patient ID: Duane Sages., male    DOB: July 26, 1976, 40 y.o.   MRN: [id number]  HPI Patient is here today for annual wellness exam and follow up of chronic medical problems which includes hyperlipidemia. He is not taking medications regularly. Patient continues to work on weight. BMI is 31.7. There is a family history of hypertension but his blood pressure has been excellent at last visit and today. I think he is basically very healthy.     There are no active problems to display for this patient.  No outpatient encounter prescriptions on file as of 10/10/2015.   No facility-administered encounter medications on file as of 10/10/2015.      Review of Systems  Constitutional: Negative.   HENT: Negative.   Eyes: Negative.   Respiratory: Negative.   Cardiovascular: Negative.   Gastrointestinal: Negative.   Endocrine: Negative.   Genitourinary: Negative.   Musculoskeletal: Negative.   Skin: Negative.   Allergic/Immunologic: Negative.   Neurological: Negative.   Hematological: Negative.   Psychiatric/Behavioral: Negative.        Objective:   Physical Exam  Constitutional: He is oriented to person, place, and time. He appears well-developed and well-nourished.  HENT:  Head: Normocephalic.  Right Ear: External ear normal.  Left Ear: External ear normal.  Nose: Nose normal.  Mouth/Throat: Oropharynx is clear and moist.  Eyes: Conjunctivae and EOM are normal. Pupils are equal, round, and reactive to light.  Neck: Normal range of motion. Neck supple.  Cardiovascular: Normal rate, regular rhythm, normal heart sounds and intact distal pulses.   Pulmonary/Chest: Effort normal and breath sounds normal.  Abdominal: Soft. Bowel sounds are normal.  Musculoskeletal: Normal range of motion.  Neurological: He is alert and oriented to person, place, and time.  Skin: Skin is warm and dry.  Psychiatric: He has a normal mood and affect. His behavior is normal.  Judgment and thought content normal.    BP 118/81 mmHg  Pulse 74  Temp(Src) 97.3 F (36.3 C) (Oral)  Ht '5\' 9"'$  (1.753 m)  Wt 210 lb (95.255 kg)  BMI 31.00 kg/m2       Assessment & Plan:  1. Annual physical exam Them is normal except for being overweight. Encourage diet and exercise. - CMP14+EGFR - Lipid panel  2. HLD (hyperlipidemia) On no medicines. Triglycerides are elevated at last physical and LDL is almost at goal at 106. - Lipid panel   Wardell Honour MD

## 2015-10-10 NOTE — Patient Instructions (Signed)
Continue current medications. Continue good therapeutic lifestyle changes which include good diet and exercise. Fall precautions discussed with patient. If an FOBT was given today- please return it to our front desk. If you are over 39 years old - you may need Prevnar 13 or the adult Pneumonia vaccine.  **Flu shots are available--- please call and schedule a FLU-CLINIC appointment**  After your visit with us today you will receive a survey in the mail or online from [employer] regarding your care with us. Please take a moment to fill this out. Your feedback is very important to us as you can help us better understand your patient needs as well as improve your experience and satisfaction. WE CARE ABOUT YOU!!!    

## 2015-10-11 LAB — CMP14+EGFR
ALK PHOS: 83 IU/L (ref 39–117)
ALT: 99 IU/L — ABNORMAL HIGH (ref 0–44)
AST: 44 IU/L — ABNORMAL HIGH (ref 0–40)
Albumin/Globulin Ratio: 1.4 (ref 1.2–2.2)
Albumin: 4.4 g/dL (ref 3.5–5.5)
BUN/Creatinine Ratio: 13 (ref 8–19)
BUN: 10 mg/dL (ref 6–20)
Bilirubin Total: 0.5 mg/dL (ref 0.0–1.2)
CO2: 24 mmol/L (ref 18–29)
CREATININE: 0.75 mg/dL — AB (ref 0.76–1.27)
Calcium: 9.5 mg/dL (ref 8.7–10.2)
Chloride: 99 mmol/L (ref 96–106)
GFR calc Af Amer: 134 mL/min/{1.73_m2} (ref >59)
GFR calc non Af Amer: 116 mL/min/{1.73_m2} (ref >59)
GLOBULIN, TOTAL: 3.1 g/dL (ref 1.5–4.5)
Glucose: 103 mg/dL — ABNORMAL HIGH (ref 65–99)
Potassium: 4.5 mmol/L (ref 3.5–5.2)
SODIUM: 139 mmol/L (ref 134–144)
Total Protein: 7.5 g/dL (ref 6.0–8.5)

## 2015-10-11 LAB — LIPID PANEL
CHOLESTEROL TOTAL: 236 mg/dL — AB (ref 100–199)
Chol/HDL Ratio: 6.9 ratio units — ABNORMAL HIGH (ref 0.0–5.0)
HDL: 34 mg/dL — ABNORMAL LOW (ref >39)
LDL Calculated: 145 mg/dL — ABNORMAL HIGH (ref 0–99)
Triglycerides: 285 mg/dL — ABNORMAL HIGH (ref 0–149)
VLDL Cholesterol Cal: 57 mg/dL — ABNORMAL HIGH (ref 5–40)

## 2015-12-02 DIAGNOSIS — S0501XA Injury of conjunctiva and corneal abrasion without foreign body, right eye, initial encounter: Secondary | ICD-10-CM | POA: Diagnosis not present

## 2015-12-02 DIAGNOSIS — H578 Other specified disorders of eye and adnexa: Secondary | ICD-10-CM | POA: Diagnosis not present

## 2015-12-02 DIAGNOSIS — T1501XA Foreign body in cornea, right eye, initial encounter: Secondary | ICD-10-CM | POA: Diagnosis not present

## 2015-12-25 ENCOUNTER — Ambulatory Visit: Payer: BLUE CROSS/BLUE SHIELD | Admitting: Family Medicine

## 2015-12-29 ENCOUNTER — Encounter: Payer: Self-pay | Admitting: Physician Assistant

## 2015-12-29 ENCOUNTER — Ambulatory Visit (INDEPENDENT_AMBULATORY_CARE_PROVIDER_SITE_OTHER): Payer: BLUE CROSS/BLUE SHIELD | Admitting: Physician Assistant

## 2015-12-29 VITALS — BP 107/67 | HR 55 | Temp 97.1°F | Ht 69.0 in | Wt 212.0 lb

## 2015-12-29 DIAGNOSIS — M7710 Lateral epicondylitis, unspecified elbow: Secondary | ICD-10-CM | POA: Diagnosis not present

## 2015-12-29 MED ORDER — MELOXICAM 15 MG PO TABS: 15.0000 mg | ORAL_TABLET | Freq: Every day | ORAL | Status: DC

## 2015-12-29 NOTE — Patient Instructions (Signed)
Lateral Epicondylitis With Rehab Lateral epicondylitis involves inflammation and pain around the outer portion of the elbow. The pain is caused by inflammation of the tendons in the forearm that bring back (extend) the wrist. Lateral epicondylitis is also called tennis elbow, because it is very common in tennis players. However, it may occur in any individual who extends the wrist repetitively. If lateral epicondylitis is left untreated, it may become a chronic problem. SYMPTOMS   Pain, tenderness, and inflammation on the outer (lateral) side of the elbow.  Pain or weakness with gripping activities.  Pain that increases with wrist-twisting motions (playing tennis, using a screwdriver, opening a door or a jar).  Pain with lifting objects, including a coffee cup. CAUSES  Lateral epicondylitis is caused by inflammation of the tendons that extend the wrist. Causes of injury may include:  Repetitive stress and strain on the muscles and tendons that extend the wrist.  Sudden change in activity level or intensity.  Incorrect grip in racquet sports.  Incorrect grip size of racquet (often too large).  Incorrect hitting position or technique (usually backhand, leading with the elbow).  Using a racket that is too heavy. RISK INCREASES WITH:  Sports or occupations that require repetitive and/or strenuous forearm and wrist movements (tennis, squash, racquetball, carpentry).  Poor wrist and forearm strength and flexibility.  Failure to warm up properly before activity.  Resuming activity before healing, rehabilitation, and conditioning are complete. PREVENTION   Warm up and stretch properly before activity.  Maintain physical fitness:  Strength, flexibility, and endurance.  Cardiovascular fitness.  Wear and use properly fitted equipment.  Learn and use proper technique and have a coach correct improper technique.  Wear a tennis elbow (counterforce) brace. PROGNOSIS  The course of  this condition depends on the degree of the injury. If treated properly, acute cases (symptoms lasting less than 4 weeks) are often resolved in 2 to 6 weeks. Chronic (longer lasting cases) often resolve in 3 to 6 months but may require physical therapy. RELATED COMPLICATIONS   Frequently recurring symptoms, resulting in a chronic problem. Properly treating the problem the first time decreases frequency of recurrence.  Chronic inflammation, scarring tendon degeneration, and partial tendon tear, requiring surgery.  Delayed healing or resolution of symptoms. TREATMENT  Treatment first involves the use of ice and medicine to reduce pain and inflammation. Strengthening and stretching exercises may help reduce discomfort if performed regularly. These exercises may be performed at home if the condition is an acute injury. Chronic cases may require a referral to a physical therapist for evaluation and treatment. Your caregiver may advise a corticosteroid injection to help reduce inflammation. Rarely, surgery is needed. MEDICATION  If pain medicine is needed, nonsteroidal anti-inflammatory medicines (aspirin and ibuprofen), or other minor pain relievers (acetaminophen), are often advised.  Do not take pain medicine for 7 days before surgery.  Prescription pain relievers may be given, if your caregiver thinks they are needed. Use only as directed and only as much as you need.  Corticosteroid injections may be recommended. These injections should be reserved only for the most severe cases, because they can only be given a certain number of times. HEAT AND COLD  Cold treatment (icing) should be applied for 10 to 15 minutes every 2 to 3 hours for inflammation and pain, and immediately after activity that aggravates your symptoms. Use ice packs or an ice massage.  Heat treatment may be used before performing stretching and strengthening activities prescribed by your caregiver, physical therapist,   or  athletic trainer. Use a heat pack or a warm water soak. SEEK MEDICAL CARE IF: Symptoms get worse or do not improve in 2 weeks, despite treatment. EXERCISES  RANGE OF MOTION (ROM) AND STRETCHING EXERCISES - Epicondylitis, Lateral (Tennis Elbow) These exercises may help you when beginning to rehabilitate your injury. Your symptoms may go away with or without further involvement from your physician, physical therapist, or athletic trainer. While completing these exercises, remember:   Restoring tissue flexibility helps normal motion to return to the joints. This allows healthier, less painful movement and activity.  An effective stretch should be held for at least 30 seconds.  A stretch should never be painful. You should only feel a gentle lengthening or release in the stretched tissue. RANGE OF MOTION - Wrist Flexion, Active-Assisted  Extend your right / left elbow with your fingers pointing down.*  Gently pull the back of your hand towards you, until you feel a gentle stretch on the top of your forearm.  Hold this position for __________ seconds. Repeat __________ times. Complete this exercise __________ times per day.  *If directed by your physician, physical therapist or athletic trainer, complete this stretch with your elbow bent, rather than extended. RANGE OF MOTION - Wrist Extension, Active-Assisted  Extend your right / left elbow and turn your palm upwards.*  Gently pull your palm and fingertips back, so your wrist extends and your fingers point more toward the ground.  You should feel a gentle stretch on the inside of your forearm.  Hold this position for __________ seconds. Repeat __________ times. Complete this exercise __________ times per day. *If directed by your physician, physical therapist or athletic trainer, complete this stretch with your elbow bent, rather than extended. STRETCH - Wrist Flexion  Place the back of your right / left hand on a tabletop, leaving your  elbow slightly bent. Your fingers should point away from your body.  Gently press the back of your hand down onto the table by straightening your elbow. You should feel a stretch on the top of your forearm.  Hold this position for __________ seconds. Repeat __________ times. Complete this stretch __________ times per day.  STRETCH - Wrist Extension   Place your right / left fingertips on a tabletop, leaving your elbow slightly bent. Your fingers should point backwards.  Gently press your fingers and palm down onto the table by straightening your elbow. You should feel a stretch on the inside of your forearm.  Hold this position for __________ seconds. Repeat __________ times. Complete this stretch __________ times per day.  STRENGTHENING EXERCISES - Epicondylitis, Lateral (Tennis Elbow) These exercises may help you when beginning to rehabilitate your injury. They may resolve your symptoms with or without further involvement from your physician, physical therapist, or athletic trainer. While completing these exercises, remember:   Muscles can gain both the endurance and the strength needed for everyday activities through controlled exercises.  Complete these exercises as instructed by your physician, physical therapist or athletic trainer. Increase the resistance and repetitions only as guided.  You may experience muscle soreness or fatigue, but the pain or discomfort you are trying to eliminate should never worsen during these exercises. If this pain does get worse, stop and make sure you are following the directions exactly. If the pain is still present after adjustments, discontinue the exercise until you can discuss the trouble with your caregiver. STRENGTH - Wrist Flexors  Sit with your right / left forearm palm-up and fully supported   on a table or countertop. Your elbow should be resting below the height of your shoulder. Allow your wrist to extend over the edge of the  surface.  Loosely holding a __________ weight, or a piece of rubber exercise band or tubing, slowly curl your hand up toward your forearm.  Hold this position for __________ seconds. Slowly lower the wrist back to the starting position in a controlled manner. Repeat __________ times. Complete this exercise __________ times per day.  STRENGTH - Wrist Extensors  Sit with your right / left forearm palm-down and fully supported on a table or countertop. Your elbow should be resting below the height of your shoulder. Allow your wrist to extend over the edge of the surface.  Loosely holding a __________ weight, or a piece of rubber exercise band or tubing, slowly curl your hand up toward your forearm.  Hold this position for __________ seconds. Slowly lower the wrist back to the starting position in a controlled manner. Repeat __________ times. Complete this exercise __________ times per day.  STRENGTH - Ulnar Deviators  Stand with a ____________________ weight in your right / left hand, or sit while holding a rubber exercise band or tubing, with your healthy arm supported on a table or countertop.  Move your wrist, so that your pinkie travels toward your forearm and your thumb moves away from your forearm.  Hold this position for __________ seconds and then slowly lower the wrist back to the starting position. Repeat __________ times. Complete this exercise __________ times per day STRENGTH - Radial Deviators  Stand with a ____________________ weight in your right / left hand, or sit while holding a rubber exercise band or tubing, with your injured arm supported on a table or countertop.  Raise your hand upward in front of you or pull up on the rubber tubing.  Hold this position for __________ seconds and then slowly lower the wrist back to the starting position. Repeat __________ times. Complete this exercise __________ times per day. STRENGTH - Forearm Supinators   Sit with your right /  left forearm supported on a table, keeping your elbow below shoulder height. Rest your hand over the edge, palm down.  Gently grip a hammer or a soup ladle.  Without moving your elbow, slowly turn your palm and hand upward to a "thumbs-up" position.  Hold this position for __________ seconds. Slowly return to the starting position. Repeat __________ times. Complete this exercise __________ times per day.  STRENGTH - Forearm Pronators   Sit with your right / left forearm supported on a table, keeping your elbow below shoulder height. Rest your hand over the edge, palm up.  Gently grip a hammer or a soup ladle.  Without moving your elbow, slowly turn your palm and hand upward to a "thumbs-up" position.  Hold this position for __________ seconds. Slowly return to the starting position. Repeat __________ times. Complete this exercise __________ times per day.  STRENGTH - Grip  Grasp a tennis ball, a dense sponge, or a large, rolled sock in your hand.  Squeeze as hard as you can, without increasing any pain.  Hold this position for __________ seconds. Release your grip slowly. Repeat __________ times. Complete this exercise __________ times per day.  STRENGTH - Elbow Extensors, Isometric  Stand or sit upright, on a firm surface. Place your right / left arm so that your palm faces your stomach, and it is at the height of your waist.  Place your opposite hand on the underside   of your forearm. Gently push up as your right / left arm resists. Push as hard as you can with both arms, without causing any pain or movement at your right / left elbow. Hold this stationary position for __________ seconds. Gradually release the tension in both arms. Allow your muscles to relax completely before repeating.   This information is not intended to replace advice given to you by your health care provider. Make sure you discuss any questions you have with your health care provider.   Document Released:  07/12/2005 Document Revised: 08/02/2014 Document Reviewed: 10/24/2008 [employer] Interactive Patient Education 2016 [employer]  

## 2015-12-29 NOTE — Progress Notes (Signed)
Subjective:     Patient ID: Duane MillerMacario Charpentier Jr., male   DOB: 09/15/1975, 40 y.o.   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 Lateral epicondylitis, unspecified laterality        Plan:     Mobic 15mg  1 po qd #14 Heat/Ice OTC bracing Nl course reviewed INB return for injection

## 2016-02-20 ENCOUNTER — Ambulatory Visit: Payer: BLUE CROSS/BLUE SHIELD | Admitting: Family Medicine

## 2016-03-17 DIAGNOSIS — E785 Hyperlipidemia, unspecified: Secondary | ICD-10-CM | POA: Diagnosis not present

## 2016-03-17 DIAGNOSIS — M25572 Pain in left ankle and joints of left foot: Secondary | ICD-10-CM | POA: Diagnosis not present

## 2016-03-17 DIAGNOSIS — M25561 Pain in right knee: Secondary | ICD-10-CM | POA: Diagnosis not present

## 2016-03-17 DIAGNOSIS — M799 Soft tissue disorder, unspecified: Secondary | ICD-10-CM | POA: Diagnosis not present

## 2016-03-17 DIAGNOSIS — M25562 Pain in left knee: Secondary | ICD-10-CM | POA: Diagnosis not present

## 2016-03-17 DIAGNOSIS — M25571 Pain in right ankle and joints of right foot: Secondary | ICD-10-CM | POA: Diagnosis not present

## 2016-03-17 DIAGNOSIS — M25461 Effusion, right knee: Secondary | ICD-10-CM | POA: Diagnosis not present

## 2016-03-17 DIAGNOSIS — Z23 Encounter for immunization: Secondary | ICD-10-CM | POA: Diagnosis not present

## 2016-04-12 ENCOUNTER — Encounter: Payer: Self-pay | Admitting: Family

## 2016-04-12 ENCOUNTER — Ambulatory Visit (INDEPENDENT_AMBULATORY_CARE_PROVIDER_SITE_OTHER): Payer: BLUE CROSS/BLUE SHIELD | Admitting: Family

## 2016-04-12 VITALS — BP 121/82 | HR 71 | Temp 98.9°F | Ht 69.0 in | Wt 211.0 lb

## 2016-04-12 DIAGNOSIS — M7712 Lateral epicondylitis, left elbow: Secondary | ICD-10-CM | POA: Diagnosis not present

## 2016-04-12 DIAGNOSIS — J069 Acute upper respiratory infection, unspecified: Secondary | ICD-10-CM | POA: Diagnosis not present

## 2016-04-12 MED ORDER — NAPROXEN 500 MG PO TABS: 500.0000 mg | ORAL_TABLET | Freq: Two times a day (BID) | ORAL | 1 refills | Status: DC

## 2016-04-12 MED ORDER — PREDNISONE 10 MG (21) PO TBPK: ORAL_TABLET | ORAL | 0 refills | Status: DC

## 2016-04-12 NOTE — Progress Notes (Signed)
Subjective:    Patient ID: [name]., male    DOB: [birth year], 40 y.o.   MRN: [id number]  Sinus Problem  This is a new problem. The current episode started in the past 7 days. The problem has been waxing and waning since onset. His pain is at a severity of 5/10. The pain is mild. Associated symptoms include congestion, coughing, ear pain, headaches, a hoarse voice, sinus pressure, sneezing and a sore throat. Pertinent negatives include no chills or swollen glands. Past treatments include acetaminophen and oral decongestants. The treatment provided mild relief.  Sore Throat   Associated symptoms include congestion, coughing, ear pain, headaches and a hoarse voice. Pertinent negatives include no swollen glands.  Cough  Associated symptoms include ear pain, headaches, postnasal drip and a sore throat. Pertinent negatives include no chills.  Arm Pain   The incident occurred more than 1 week ago. The injury mechanism was repetitive motion. The pain is present in the left elbow. The quality of the pain is described as aching. The pain is at a severity of 9/10. The pain is moderate. The pain has been constant since the incident. Pertinent negatives include no numbness or tingling. He has tried acetaminophen, heat and NSAIDs for the symptoms. The treatment provided mild relief.      Review of Systems  Constitutional: Negative for chills.  HENT: Positive for congestion, ear pain, hoarse voice, postnasal drip, sinus pressure, sneezing and sore throat.   Respiratory: Positive for cough.   Neurological: Positive for headaches. Negative for tingling and numbness.  All other systems reviewed and are negative.      Objective:   Physical Exam  Constitutional: He is oriented to person, place, and time. He appears well-developed and well-nourished. No distress.  HENT:  Head: Normocephalic.  Right Ear: External ear normal.  Left Ear: External ear normal.  Nose: Mucosal edema and rhinorrhea  present.  Mouth/Throat: Posterior oropharyngeal edema present.  Eyes: Pupils are equal, round, and reactive to light. Right eye exhibits no discharge. Left eye exhibits no discharge.  Neck: Normal range of motion. Neck supple. No thyromegaly present.  Cardiovascular: Normal rate, regular rhythm, normal heart sounds and intact distal pulses.   No murmur heard. Pulmonary/Chest: Effort normal and breath sounds normal. No respiratory distress. He has no wheezes.  Abdominal: Soft. Bowel sounds are normal. He exhibits no distension. There is no tenderness.  Musculoskeletal: Normal range of motion. He exhibits tenderness (lateral left elbow tenderness with flexion and extension). He exhibits no edema.  Neurological: He is alert and oriented to person, place, and time. He has normal reflexes. No cranial nerve deficit.  Skin: Skin is warm and dry. No rash noted. No erythema.  Psychiatric: He has a normal mood and affect. His behavior is normal. Judgment and thought content normal.  Vitals reviewed.     BP 121/82   Pulse 71   Temp 98.9 F (37.2 C) (Oral)   Ht 5\' 9"  (1.753 m)   Wt 211 lb (95.7 kg)   BMI 31.16 kg/m      Assessment & Plan:  1. Viral upper respiratory infection -- Take meds as prescribed - Use a cool mist humidifier  -Use saline nose sprays frequently -Saline irrigations of the nose can be very helpful if done frequently.  * 4X daily for 1 week*  * Use of a nettie pot can be helpful with this. Follow directions with this* -Force fluids -For any cough or congestion  Use plain Mucinex- regular  strength or max strength is fine   * Children- consult with Pharmacist for dosing -For fever or aces or pains- take tylenol or ibuprofen appropriate for age and weight.  * for fevers greater than 101 orally you may alternate ibuprofen and tylenol every  3 hours. -Throat lozenges if help -New toothbrush in 3 days  2. Tennis elbow syndrome, left -Rest -Ice -RTO prn  - predniSONE  (STERAPRED UNI-PAK 21 TAB) 10 MG (21) TBPK tablet; Use as directed  Dispense: 21 tablet; Refill: 0 - naproxen (NAPROSYN) 500 MG tablet; Take 1 tablet (500 mg total) by mouth 2 (two) times daily with a meal.  Dispense: 60 tablet; Refill: 1  Jannifer Rodneyhristy Kareemah Grounds, FNP

## 2016-04-12 NOTE — Patient Instructions (Addendum)
Upper Respiratory Infection, Adult Most upper respiratory infections (URIs) are a viral infection of the air passages leading to the lungs. A URI affects the nose, throat, and upper air passages. The most common type of URI is nasopharyngitis and is typically referred to as "the common cold." URIs run their course and usually go away on their own. Most of the time, a URI does not require medical attention, but sometimes a bacterial infection in the upper airways can follow a viral infection. This is called a secondary infection. Sinus and middle ear infections are common types of secondary upper respiratory infections. Bacterial pneumonia can also complicate a URI. A URI can worsen asthma and chronic obstructive pulmonary disease (COPD). Sometimes, these complications can require emergency medical care and may be life threatening.  CAUSES Almost all URIs are caused by viruses. A virus is a type of germ and can spread from one person to another.  RISKS FACTORS You may be at risk for a URI if:   You smoke.   You have chronic heart or lung disease.  You have a weakened defense (immune) system.   You are very young or very old.   You have nasal allergies or asthma.  You work in crowded or poorly ventilated areas.  You work in health care facilities or schools. SIGNS AND SYMPTOMS  Symptoms typically develop 2-3 days after you come in contact with a cold virus. Most viral URIs last 7-10 days. However, viral URIs from the influenza virus (flu virus) can last 14-18 days and are typically more severe. Symptoms may include:   Runny or stuffy (congested) nose.   Sneezing.   Cough.   Sore throat.   Headache.   Fatigue.   Fever.   Loss of appetite.   Pain in your forehead, behind your eyes, and over your cheekbones (sinus pain).  Muscle aches.  DIAGNOSIS  Your health care provider may diagnose a URI by:  Physical exam.  Tests to check that your symptoms are not due to  another condition such as:  Strep throat.  Sinusitis.  Pneumonia.  Asthma. TREATMENT  A URI goes away on its own with time. It cannot be cured with medicines, but medicines may be prescribed or recommended to relieve symptoms. Medicines may help:  Reduce your fever.  Reduce your cough.  Relieve nasal congestion. HOME CARE INSTRUCTIONS   Take medicines only as directed by your health care provider.   Gargle warm saltwater or take cough drops to comfort your throat as directed by your health care provider.  Use a warm mist humidifier or inhale steam from a shower to increase air moisture. This may make it easier to breathe.  Drink enough fluid to keep your urine clear or pale yellow.   Eat soups and other clear broths and maintain good nutrition.   Rest as needed.   Return to work when your temperature has returned to normal or as your health care provider advises. You may need to stay home longer to avoid infecting others. You can also use a face mask and careful hand washing to prevent spread of the virus.  Increase the usage of your inhaler if you have asthma.   Do not use any tobacco products, including cigarettes, chewing tobacco, or electronic cigarettes. If you need help quitting, ask your health care provider. PREVENTION  The best way to protect yourself from getting a cold is to practice good hygiene.   Avoid oral or hand contact with people with cold   symptoms.   Wash your hands often if contact occurs.  There is no clear evidence that vitamin C, vitamin E, echinacea, or exercise reduces the chance of developing a cold. However, it is always recommended to get plenty of rest, exercise, and practice good nutrition.  SEEK MEDICAL CARE IF:   You are getting worse rather than better.   Your symptoms are not controlled by medicine.   You have chills.  You have worsening shortness of breath.  You have brown or red mucus.  You have yellow or brown nasal  discharge.  You have pain in your face, especially when you bend forward.  You have a fever.  You have swollen neck glands.  You have pain while swallowing.  You have white areas in the back of your throat. SEEK IMMEDIATE MEDICAL CARE IF:   You have severe or persistent:  Headache.  Ear pain.  Sinus pain.  Chest pain.  You have chronic lung disease and any of the following:  Wheezing.  Prolonged cough.  Coughing up blood.  A change in your usual mucus.  You have a stiff neck.  You have changes in your:  Vision.  Hearing.  Thinking.  Mood. MAKE SURE YOU:   Understand these instructions.  Will watch your condition.  Will get help right away if you are not doing well or get worse.   This information is not intended to replace advice given to you by your health care provider. Make sure you discuss any questions you have with your health care provider.   Document Released: 01/05/2001 Document Revised: 11/26/2014 Document Reviewed: 10/17/2013 Elsevier Interactive Patient Education 2016 [employer]   Tennis Elbow Tennis elbow (lateral epicondylitis) is inflammation of the outer tendons of your forearm close to your elbow. Your tendons attach your muscles to your bones. The outer tendons of your forearm are used to extend your wrist, and they attach on the outside part of your elbow. Tennis elbow is often found in people who play tennis, but anyone may get the condition from repeatedly extending the wrist or turning the forearm. CAUSES This condition is caused by repeatedly extending your wrist and using your hands. It can result from sports or work that requires repetitive forearm movements. Tennis elbow may also be caused by an injury. RISK FACTORS You have a higher risk of developing tennis elbow if you play tennis or another racquet sport. You also have a higher risk if you frequently use your hands for work. This condition is also more likely to develop  in:  Musicians.  Carpenters, painters, and plumbers.  Cooks.  Cashiers.  People who work in [employer].  Holiday representativeConstruction workers.  Butchers.  People who use computers. SYMPTOMS Symptoms of this condition include:  Pain and tenderness in your forearm and the outer part of your elbow. You may only feel the pain when you use your arm, or you may feel it even when you are not using your arm.  A burning feeling that runs from your elbow through your arm.  Weak grip in your hands. DIAGNOSIS  This condition may be diagnosed by medical history and physical exam. You may also have other tests, including:  X-rays.  MRI. TREATMENT Your health care provider will recommend lifestyle adjustments, such as resting and icing your arm. Treatment may also include:  Medicines for inflammation. This may include shots of cortisone if your pain continues.  Physical therapy. This may include massage or exercises.  An elbow brace. Surgery may  eventually be recommended if your pain does not go away with treatment. HOME CARE INSTRUCTIONS Activity  Rest your elbow and wrist as directed by your health care provider. Try to avoid any activities that caused the problem until your health care provider says that you can do them again.  If a physical therapist teaches you exercises, do all of them as directed.  If you lift an object, lift it with your palm facing upward. This lowers the stress on your elbow. Lifestyle  If your tennis elbow is caused by sports, check your equipment and make sure that:  You are using it correctly.  It is the best fit for you.  If your tennis elbow is caused by work, take breaks frequently, if you are able. Talk with your manager about how to best perform tasks in a way that is safe.  If your tennis elbow is caused by computer use, talk with your manager about any changes that can be made to your work environment. General Instructions  If directed, apply ice to  the painful area:  Put ice in a plastic bag.  Place a towel between your skin and the bag.  Leave the ice on for 20 minutes, 2-3 times per day.  Take medicines only as directed by your health care provider.  If you were given a brace, wear it as directed by your health care provider.  Keep all follow-up visits as directed by your health care provider. This is important. SEEK MEDICAL CARE IF:  Your pain does not get better with treatment.  Your pain gets worse.  You have numbness or weakness in your forearm, hand, or fingers.   This information is not intended to replace advice given to you by your health care provider. Make sure you discuss any questions you have with your health care provider.   Document Released: 07/12/2005 Document Revised: 11/26/2014 Document Reviewed: 07/08/2014 Elsevier Interactive Patient Education [employer]

## 2016-05-14 DIAGNOSIS — Z23 Encounter for immunization: Secondary | ICD-10-CM | POA: Diagnosis not present

## 2016-05-20 DIAGNOSIS — M25561 Pain in right knee: Secondary | ICD-10-CM | POA: Diagnosis not present

## 2016-05-20 DIAGNOSIS — M25562 Pain in left knee: Secondary | ICD-10-CM | POA: Diagnosis not present

## 2016-05-20 DIAGNOSIS — K219 Gastro-esophageal reflux disease without esophagitis: Secondary | ICD-10-CM | POA: Diagnosis not present

## 2016-05-20 DIAGNOSIS — E785 Hyperlipidemia, unspecified: Secondary | ICD-10-CM | POA: Diagnosis not present

## 2016-08-20 DIAGNOSIS — M25569 Pain in unspecified knee: Secondary | ICD-10-CM | POA: Diagnosis not present

## 2016-08-20 DIAGNOSIS — E785 Hyperlipidemia, unspecified: Secondary | ICD-10-CM | POA: Diagnosis not present

## 2016-08-20 DIAGNOSIS — M7710 Lateral epicondylitis, unspecified elbow: Secondary | ICD-10-CM | POA: Diagnosis not present

## 2016-08-20 DIAGNOSIS — K219 Gastro-esophageal reflux disease without esophagitis: Secondary | ICD-10-CM | POA: Diagnosis not present

## 2016-12-11 DIAGNOSIS — H04123 Dry eye syndrome of bilateral lacrimal glands: Secondary | ICD-10-CM | POA: Diagnosis not present

## 2016-12-25 DIAGNOSIS — H04123 Dry eye syndrome of bilateral lacrimal glands: Secondary | ICD-10-CM | POA: Diagnosis not present

## 2017-02-17 DIAGNOSIS — M25562 Pain in left knee: Secondary | ICD-10-CM | POA: Diagnosis not present

## 2017-02-17 DIAGNOSIS — K219 Gastro-esophageal reflux disease without esophagitis: Secondary | ICD-10-CM | POA: Diagnosis not present

## 2017-02-17 DIAGNOSIS — E785 Hyperlipidemia, unspecified: Secondary | ICD-10-CM | POA: Diagnosis not present

## 2017-02-17 DIAGNOSIS — M25561 Pain in right knee: Secondary | ICD-10-CM | POA: Diagnosis not present

## 2017-03-03 DIAGNOSIS — H5213 Myopia, bilateral: Secondary | ICD-10-CM | POA: Diagnosis not present

## 2017-04-18 ENCOUNTER — Encounter: Payer: Self-pay | Admitting: Family Medicine

## 2017-04-18 ENCOUNTER — Ambulatory Visit (INDEPENDENT_AMBULATORY_CARE_PROVIDER_SITE_OTHER): Payer: BLUE CROSS/BLUE SHIELD | Admitting: Family Medicine

## 2017-04-18 VITALS — BP 119/77 | HR 75 | Temp 97.1°F | Ht 69.0 in | Wt 222.0 lb

## 2017-04-18 DIAGNOSIS — S86112A Strain of other muscle(s) and tendon(s) of posterior muscle group at lower leg level, left leg, initial encounter: Secondary | ICD-10-CM | POA: Diagnosis not present

## 2017-04-18 NOTE — Progress Notes (Signed)
BP 119/77   Pulse 75   Temp (!) 97.1 F (36.2 C) (Oral)   Ht  (1.753 m)   Wt 222 lb (100.7 kg)   BMI 32.78 kg/m    Subjective:    Patient ID: Duane Murphy., male    DOB: 1975/08/31, 41 y.o.   MRN: [id number]  HPI: Duane Murphy. is a 41 y.o. male presenting on 04/18/2017 for Left calf pain (since Saturday, felt and heard pop in leg while playing flag football with kids at church)   HPI Left calf pain Patient injured his leg 2 days ago playing flag football at church. He said that he was making a move and felt something pop in his leg and had to hobble off the field and since then has not been able to ambulate on that leg but is able to bear weight but does cause some pain. He has been using crutches. 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 He says the pain is moderate to severe and mostly in the upper posterior aspect of the left calf and does not really radiate anywhere else. He denies any fevers or chills or numbness or weakness. He does have some pain with passive dorsiflexion but is improved with plantar flexion. The strength. Him appears to be intact but just has pain in there.  Relevant past medical, surgical, family and social history reviewed and updated as indicated. Interim medical history since our last visit reviewed. Allergies and medications reviewed and updated.  Review of Systems  Constitutional: Negative for chills and fever.  Respiratory: Negative for shortness of breath and wheezing.   Cardiovascular: Negative for chest pain and leg swelling.  Musculoskeletal: Positive for myalgias. Negative for back pain and gait problem.  Skin: Negative for color change and rash.  All other systems reviewed and are negative.   Per HPI unless specifically indicated above     Objective:    BP 119/77   Pulse 75   Temp (!) 97.1 F (36.2 C) (Oral)   Ht  (1.753 m)   Wt 222 lb (100.7 kg)   BMI 32.78 kg/m   Wt  Readings from Last 3 Encounters:  04/18/17 222 lb (100.7 kg)  04/12/16 211 lb (95.7 kg)  12/29/15 212 lb (96.2 kg)    Physical Exam  Constitutional: He is oriented to person, place, and time. He appears well-developed and well-nourished. No distress.  Eyes: Conjunctivae are normal. No scleral icterus.  Cardiovascular: Normal rate, regular rhythm, normal heart sounds and intact distal pulses.   No murmur heard. Pulmonary/Chest: Effort normal and breath sounds normal. No respiratory distress. He has no wheezes. He has no rales.  Musculoskeletal: Normal range of motion. He exhibits no edema.       Left lower leg: He exhibits tenderness and swelling. He exhibits no bony tenderness, no edema and no deformity.       Legs: Neurological: He is alert and oriented to person, place, and time. Coordination normal.  Skin: Skin is warm and dry. No rash noted. He is not diaphoretic.  Psychiatric: He has a normal mood and affect. His behavior is normal.  Nursing note and vitals reviewed.     Assessment & Plan:   Problem List Items Addressed This Visit    None    Visit Diagnoses    Traumatic rupture of left plantaris muscle, initial encounter    -  Primary   Recommend  stretching and ice and compression and elevation       Follow up plan: Return if symptoms worsen or fail to improve.  Counseling provided for all of the vaccine components No orders of the defined types were placed in this encounter.   Arville Care, MD [facility] 04/18/2017, 9:14 AM

## 2017-04-19 ENCOUNTER — Telehealth: Payer: Self-pay | Admitting: Family Medicine

## 2017-04-19 NOTE — Telephone Encounter (Signed)
Okay to do note as long as he is better and capable

## 2017-04-19 NOTE — Telephone Encounter (Signed)
Please review and advise.

## 2017-04-22 ENCOUNTER — Encounter: Payer: Self-pay | Admitting: Family Medicine

## 2017-04-22 ENCOUNTER — Ambulatory Visit (INDEPENDENT_AMBULATORY_CARE_PROVIDER_SITE_OTHER): Payer: BLUE CROSS/BLUE SHIELD | Admitting: Family Medicine

## 2017-04-22 VITALS — BP 129/73 | HR 62 | Temp 97.3°F | Ht 69.0 in | Wt 221.4 lb

## 2017-04-22 DIAGNOSIS — Z23 Encounter for immunization: Secondary | ICD-10-CM | POA: Diagnosis not present

## 2017-04-22 DIAGNOSIS — Z0000 Encounter for general adult medical examination without abnormal findings: Secondary | ICD-10-CM

## 2017-04-22 NOTE — Patient Instructions (Signed)
Great to see you!   Health Maintenance, Male A healthy lifestyle and preventive care is important for your health and wellness. Ask your health care provider about what schedule of regular examinations is right for you. What should I know about weight and diet? Eat a Healthy Diet  Eat plenty of vegetables, fruits, whole grains, low-fat dairy products, and lean protein.  Do not eat a lot of foods high in solid fats, added sugars, or salt.  Maintain a Healthy Weight Regular exercise can help you achieve or maintain a healthy weight. You should:  Do at least 150 minutes of exercise each week. The exercise should increase your heart rate and make you sweat (moderate-intensity exercise).  Do strength-training exercises at least twice a week.  Watch Your Levels of Cholesterol and Blood Lipids  Have your blood tested for lipids and cholesterol every 5 years starting at 40 years of age. If you are at high risk for heart disease, you should start having your blood tested when you are 40 years old. You may need to have your cholesterol levels checked more often if: ? Your lipid or cholesterol levels are high. ? You are older than 40 years of age. ? You are at high risk for heart disease.  What should I know about cancer screening? Many types of cancers can be detected early and may often be prevented. Lung Cancer  You should be screened every year for lung cancer if: ? You are a current smoker who has smoked for at least 30 years. ? You are a former smoker who has quit within the past 15 years.  Talk to your health care provider about your screening options, when you should start screening, and how often you should be screened.  Colorectal Cancer  Routine colorectal cancer screening usually begins at 40 years of age and should be repeated every 5-10 years until you are 40 years old. You may need to be screened more often if early forms of precancerous polyps or small growths are found.  Your health care provider may recommend screening at an earlier age if you have risk factors for colon cancer.  Your health care provider may recommend using home test kits to check for hidden blood in the stool.  A small camera at the end of a tube can be used to examine your colon (sigmoidoscopy or colonoscopy). This checks for the earliest forms of colorectal cancer.  Prostate and Testicular Cancer  Depending on your age and overall health, your health care provider may do certain tests to screen for prostate and testicular cancer.  Talk to your health care provider about any symptoms or concerns you have about testicular or prostate cancer.  Skin Cancer  Check your skin from head to toe regularly.  Tell your health care provider about any new moles or changes in moles, especially if: ? There is a change in a mole's size, shape, or color. ? You have a mole that is larger than a pencil eraser.  Always use sunscreen. Apply sunscreen liberally and repeat throughout the day.  Protect yourself by wearing long sleeves, pants, a wide-brimmed hat, and sunglasses when outside.  What should I know about heart disease, diabetes, and high blood pressure?  If you are 18-39 years of age, have your blood pressure checked every 3-5 years. If you are 40 years of age or older, have your blood pressure checked every year. You should have your blood pressure measured twice-once when you are at a   hospital or clinic, and once when you are not at a hospital or clinic. Record the average of the two measurements. To check your blood pressure when you are not at a hospital or clinic, you can use: ? An automated blood pressure machine at a pharmacy. ? A home blood pressure monitor.  Talk to your health care provider about your target blood pressure.  If you are between 45-79 years old, ask your health care provider if you should take aspirin to prevent heart disease.  Have regular diabetes screenings by  checking your fasting blood sugar level. ? If you are at a normal weight and have a low risk for diabetes, have this test once every three years after the age of 45. ? If you are overweight and have a high risk for diabetes, consider being tested at a younger age or more often.  A one-time screening for abdominal aortic aneurysm (AAA) by ultrasound is recommended for men aged 65-75 years who are current or former smokers. What should I know about preventing infection? Hepatitis B If you have a higher risk for hepatitis B, you should be screened for this virus. Talk with your health care provider to find out if you are at risk for hepatitis B infection. Hepatitis C Blood testing is recommended for:  Everyone born from 1945 through 1965.  Anyone with known risk factors for hepatitis C.  Sexually Transmitted Diseases (STDs)  You should be screened each year for STDs including gonorrhea and chlamydia if: ? You are sexually active and are younger than 40 years of age. ? You are older than 40 years of age and your health care provider tells you that you are at risk for this type of infection. ? Your sexual activity has changed since you were last screened and you are at an increased risk for chlamydia or gonorrhea. Ask your health care provider if you are at risk.  Talk with your health care provider about whether you are at high risk of being infected with HIV. Your health care provider may recommend a prescription medicine to help prevent HIV infection.  What else can I do?  Schedule regular health, dental, and eye exams.  Stay current with your vaccines (immunizations).  Do not use any tobacco products, such as cigarettes, chewing tobacco, and e-cigarettes. If you need help quitting, ask your health care provider.  Limit alcohol intake to no more than 2 drinks per day. One drink equals 12 ounces of beer, 5 ounces of wine, or 1 ounces of hard liquor.  Do not use street drugs.  Do not  share needles.  Ask your health care provider for help if you need support or information about quitting drugs.  Tell your health care provider if you often feel depressed.  Tell your health care provider if you have ever been abused or do not feel safe at home. This information is not intended to replace advice given to you by your health care provider. Make sure you discuss any questions you have with your health care provider. Document Released: 01/08/2008 Document Revised: 03/10/2016 Document Reviewed: 04/15/2015 Elsevier Interactive Patient Education  2018 [employer] Inc.  

## 2017-04-22 NOTE — Progress Notes (Signed)
   HPI  Patient presents today for annual physical exam.  Patient feels well and has no complaints except for his left leg pain which he was seen for 4 days ago. He states that it is improving his walking again.  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  He is watching his diet closely over the last 6 months or so.   PMH: Smoking status noted ROS: Per HPI  Objective: BP 129/73   Pulse 62   Temp (!) 97.3 F (36.3 C) (Oral)   Ht '5\' 9"'$  (1.753 m)   Wt 221 lb 6.4 oz (100.4 kg)   BMI 32.70 kg/m  Gen: NAD, alert, cooperative with exam HEENT: NCAT, oropharynx moist and clear, nares clear, EOMI, PERRLA, TMs normal bilaterally CV: RRR, good S1/S2, no murmur Resp: CTABL, no wheezes, non-labored Abd: SNTND, BS present, no guarding or organomegaly Ext: No edema, warm Neuro: Alert and oriented, No gross deficits  Assessment and plan:  # Annual physical exam Normal exam, his BMI is 32, however he is well-built muscularly so I don't believe this is actually reflective of his overall health status Labs today Return to clinic in 1 year unless needed sooner  Discussed possibility of using nitroglycerin patches for speeding the recovery of his left calf if needed, however he's already improving.   Influenza vaccine given-counseling provided for all vaccines   Orders Placed This Encounter  Procedures  . Flu Vaccine QUAD 36+ mos IM  . Lipid panel  . CBC with Differential/Platelet  . CMP14+EGFR  . TSH    Meds ordered this encounter  Medications  . OMEPRAZOLE PO    Sig: Take by mouth.  . diclofenac sodium (VOLTAREN) 1 % GEL    Sig: Apply topically 4 (four) times daily.    Laroy Apple, MD [facility] 04/22/2017, 10:21 AM

## 2017-04-22 NOTE — Telephone Encounter (Signed)
Patient seen in office today. 

## 2017-04-23 LAB — CBC WITH DIFFERENTIAL/PLATELET
BASOS ABS: 0 10*3/uL (ref 0.0–0.2)
Basos: 0 %
EOS (ABSOLUTE): 0.1 10*3/uL (ref 0.0–0.4)
Eos: 1 %
Hematocrit: 42.7 % (ref 37.5–51.0)
Hemoglobin: 14.8 g/dL (ref 13.0–17.7)
IMMATURE GRANS (ABS): 0 10*3/uL (ref 0.0–0.1)
IMMATURE GRANULOCYTES: 0 %
LYMPHS: 34 %
Lymphocytes Absolute: 2.2 10*3/uL (ref 0.7–3.1)
MCH: 29.5 pg (ref 26.6–33.0)
MCHC: 34.7 g/dL (ref 31.5–35.7)
MCV: 85 fL (ref 79–97)
Monocytes Absolute: 0.4 10*3/uL (ref 0.1–0.9)
Monocytes: 6 %
NEUTROS PCT: 59 %
Neutrophils Absolute: 3.7 10*3/uL (ref 1.4–7.0)
PLATELETS: 175 10*3/uL (ref 150–379)
RBC: 5.01 x10E6/uL (ref 4.14–5.80)
RDW: 13.9 % (ref 12.3–15.4)
WBC: 6.4 10*3/uL (ref 3.4–10.8)

## 2017-04-23 LAB — CMP14+EGFR
ALT: 71 IU/L — AB (ref 0–44)
AST: 36 IU/L (ref 0–40)
Albumin/Globulin Ratio: 1.6 (ref 1.2–2.2)
Albumin: 4.4 g/dL (ref 3.5–5.5)
Alkaline Phosphatase: 76 IU/L (ref 39–117)
BILIRUBIN TOTAL: 0.4 mg/dL (ref 0.0–1.2)
BUN/Creatinine Ratio: 13 (ref 9–20)
BUN: 11 mg/dL (ref 6–24)
CALCIUM: 9.4 mg/dL (ref 8.7–10.2)
CHLORIDE: 100 mmol/L (ref 96–106)
CO2: 22 mmol/L (ref 20–29)
Creatinine, Ser: 0.86 mg/dL (ref 0.76–1.27)
GFR, EST AFRICAN AMERICAN: 125 mL/min/{1.73_m2} (ref >59)
GFR, EST NON AFRICAN AMERICAN: 108 mL/min/{1.73_m2} (ref >59)
GLUCOSE: 88 mg/dL (ref 65–99)
Globulin, Total: 2.7 g/dL (ref 1.5–4.5)
Potassium: 4.4 mmol/L (ref 3.5–5.2)
Sodium: 139 mmol/L (ref 134–144)
TOTAL PROTEIN: 7.1 g/dL (ref 6.0–8.5)

## 2017-04-23 LAB — LIPID PANEL
CHOL/HDL RATIO: 6.5 ratio — AB (ref 0.0–5.0)
CHOLESTEROL TOTAL: 221 mg/dL — AB (ref 100–199)
HDL: 34 mg/dL — ABNORMAL LOW (ref >39)
LDL CALC: 123 mg/dL — AB (ref 0–99)
Triglycerides: 319 mg/dL — ABNORMAL HIGH (ref 0–149)
VLDL Cholesterol Cal: 64 mg/dL — ABNORMAL HIGH (ref 5–40)

## 2017-04-23 LAB — TSH: TSH: 1.86 u[IU]/mL (ref 0.450–4.500)

## 2017-05-26 ENCOUNTER — Ambulatory Visit (INDEPENDENT_AMBULATORY_CARE_PROVIDER_SITE_OTHER): Payer: BLUE CROSS/BLUE SHIELD | Admitting: Nurse Practitioner

## 2017-05-26 ENCOUNTER — Encounter: Payer: Self-pay | Admitting: Nurse Practitioner

## 2017-05-26 VITALS — BP 123/85 | HR 80 | Temp 98.7°F | Ht 69.0 in | Wt 214.0 lb

## 2017-05-26 DIAGNOSIS — J029 Acute pharyngitis, unspecified: Secondary | ICD-10-CM | POA: Diagnosis not present

## 2017-05-26 DIAGNOSIS — B9789 Other viral agents as the cause of diseases classified elsewhere: Secondary | ICD-10-CM | POA: Diagnosis not present

## 2017-05-26 DIAGNOSIS — J069 Acute upper respiratory infection, unspecified: Secondary | ICD-10-CM | POA: Diagnosis not present

## 2017-05-26 LAB — CULTURE, GROUP A STREP

## 2017-05-26 LAB — RAPID STREP SCREEN (MED CTR MEBANE ONLY): Strep Gp A Ag, IA W/Reflex: NEGATIVE

## 2017-05-26 NOTE — Patient Instructions (Signed)
1. Take meds as prescribed 2. Use a cool mist humidifier especially during the winter months and when heat has been humid. 3. Use saline nose sprays frequently 4. Saline irrigations of the nose can be very helpful if done frequently.  * 4X daily for 1 week*  * Use of a nettie pot can be helpful with this. Follow directions with this* 5. Drink plenty of fluids 6. Keep thermostat turn down low 7.For any cough or congestion  Use plain Mucinex- regular strength or max strength is fine or   Dayquil/nightquil or tylenol cold and sinus   * Children- consult with Pharmacist for dosing 8. For fever or aces or pains- take tylenol or ibuprofen appropriate for age and weight.  * for fevers greater than 101 orally you may alternate ibuprofen and tylenol every  3 hours.

## 2017-05-26 NOTE — Progress Notes (Signed)
   Subjective:    Patient ID: Duane MillerMacario Grenz Jr., male    DOB: Jan 12, 1976, 41 y.o.   MRN: [id number]  HPI Patient in the office with c/o sore throat, cough, and nasal congestion x 5 days.  He has tried Mucinex and cough drops which help a little, but he feels like he's not getting any better.   Review of Systems  Constitutional: Positive for chills and diaphoresis. Negative for fever.  HENT: Positive for congestion, rhinorrhea (clear drainage x 5 days), sinus pressure and sore throat. Negative for trouble swallowing.   Respiratory: Positive for cough. Negative for shortness of breath and wheezing.   Gastrointestinal: Negative for abdominal pain, nausea and vomiting.  Musculoskeletal: Negative for neck pain and neck stiffness.  Neurological: Positive for headaches.  All other systems reviewed and are negative.      Objective:   Physical Exam  Constitutional: He is oriented to person, place, and time. He appears well-developed and well-nourished. No distress.  HENT:  Mouth/Throat: Oropharynx is clear and moist.  Eyes: Pupils are equal, round, and reactive to light.  Neck: Normal range of motion. Neck supple.  Cardiovascular: Normal rate, regular rhythm and normal heart sounds.   Pulmonary/Chest: Effort normal and breath sounds normal. No respiratory distress. He has no wheezes.  Lymphadenopathy:    He has no cervical adenopathy.  Neurological: He is alert and oriented to person, place, and time.  Skin: Skin is warm and dry.  Psychiatric: He has a normal mood and affect. His behavior is normal.   BP 123/85   Pulse 80   Temp 98.7 F (37.1 C) (Oral)   Ht 5\' 9"  (1.753 m)   Wt 214 lb (97.1 kg)   BMI 31.60 kg/m    Assessment & Plan:   1. Sore throat   2. Viral pharyngitis   3. Viral URI with cough    1. Take meds as prescribed 2. Use a cool mist humidifier especially during the winter months and when heat has been humid. 3. Use saline nose sprays frequently 4. Saline  irrigations of the nose can be very helpful if done frequently.  * 4X daily for 1 week*  * Use of a nettie pot can be helpful with this. Follow directions with this* 5. Drink plenty of fluids 6. Keep thermostat turn down low 7.For any cough or congestion  Use plain Mucinex- regular strength or max strength is fine       Dayquil/ nightquil combination or tylenol cold and sinus   * Children- consult with Pharmacist for dosing 8. For fever or aces or pains- take tylenol or ibuprofen appropriate for age and weight.  * for fevers greater than 101 orally you may alternate ibuprofen and tylenol every  3 hours.   Mary-Margaret Daphine DeutscherMartin, FNP

## 2017-07-14 ENCOUNTER — Encounter: Payer: Self-pay | Admitting: Family Medicine

## 2017-07-14 ENCOUNTER — Ambulatory Visit (INDEPENDENT_AMBULATORY_CARE_PROVIDER_SITE_OTHER): Payer: BLUE CROSS/BLUE SHIELD | Admitting: Family Medicine

## 2017-07-14 VITALS — BP 119/77 | HR 70 | Temp 98.9°F | Ht 69.0 in | Wt 218.0 lb

## 2017-07-14 DIAGNOSIS — H6501 Acute serous otitis media, right ear: Secondary | ICD-10-CM | POA: Diagnosis not present

## 2017-07-14 DIAGNOSIS — J0110 Acute frontal sinusitis, unspecified: Secondary | ICD-10-CM

## 2017-07-14 MED ORDER — PREDNISONE 20 MG PO TABS: ORAL_TABLET | ORAL | 0 refills | Status: DC

## 2017-07-14 MED ORDER — FLUTICASONE PROPIONATE 50 MCG/ACT NA SUSP: 1.0000 | Freq: Two times a day (BID) | NASAL | 6 refills | Status: DC | PRN

## 2017-07-14 MED ORDER — AZITHROMYCIN 250 MG PO TABS: ORAL_TABLET | ORAL | 0 refills | Status: DC

## 2017-07-14 NOTE — Progress Notes (Signed)
BP 119/77   Pulse 70   Temp 98.9 F (37.2 C) (Oral)   Ht 5\' 9"  (1.753 m)   Wt 218 lb (98.9 kg)   BMI 32.19 kg/m    Subjective:    Patient ID: Duane MillerMacario Hoch Jr., male    DOB: 1976/05/24, 41 y.o.   MRN: [id number]  HPI: Duane MillerMacario Bertino Jr. is a 41 y.o. male presenting on 07/14/2017 for Right ear pain, decreased hearing and Sinusitis (nasal congestion, sinus drainage,taking Tylenol Sinus)   HPI Right ear pain and sinus Patient comes in complaining of right ear pain and sinus congestion.  He says a sinus problems and been going on for about 5 days but the ear is really started hurting him over the past 2-1/2 days.  He said it really got bad last night and that it was throbbing and felt like his ear was going to explode.  He denies any fevers or chills or shortness of breath or wheezing.  He does have a lot of nasal congestion and postnasal drainage as well.  He has been using Tylenol Sinus and Mucinex which have not helped significantly.  Relevant past medical, surgical, family and social history reviewed and updated as indicated. Interim medical history since our last visit reviewed. Allergies and medications reviewed and updated.  Review of Systems  Constitutional: Negative for chills and fever.  HENT: Positive for congestion, ear pain, hearing loss, postnasal drip, rhinorrhea, sinus pressure, sneezing and sore throat. Negative for ear discharge and voice change.   Eyes: Negative for pain, discharge, redness and visual disturbance.  Respiratory: Negative for shortness of breath and wheezing.   Cardiovascular: Negative for chest pain and leg swelling.  Musculoskeletal: Negative for gait problem.  Skin: Negative for rash.  All other systems reviewed and are negative.   Per HPI unless specifically indicated above        Objective:    BP 119/77   Pulse 70   Temp 98.9 F (37.2 C) (Oral)   Ht 5\' 9"  (1.753 m)   Wt 218 lb (98.9 kg)   BMI 32.19 kg/m   Wt Readings from  Last 3 Encounters:  07/14/17 218 lb (98.9 kg)  05/26/17 214 lb (97.1 kg)  04/22/17 221 lb 6.4 oz (100.4 kg)    Physical Exam  Constitutional: He is oriented to person, place, and time. He appears well-developed and well-nourished. No distress.  HENT:  Right Ear: External ear and ear canal normal. Tympanic membrane is bulging. Tympanic membrane is not injected, not scarred and not perforated.  Left Ear: Tympanic membrane, external ear and ear canal normal.  Nose: Mucosal edema and rhinorrhea present. No sinus tenderness. No epistaxis. Right sinus exhibits frontal sinus tenderness. Right sinus exhibits no maxillary sinus tenderness. Left sinus exhibits frontal sinus tenderness. Left sinus exhibits no maxillary sinus tenderness.  Mouth/Throat: Uvula is midline and mucous membranes are normal. Posterior oropharyngeal edema and posterior oropharyngeal erythema present. No oropharyngeal exudate or tonsillar abscesses.  Eyes: Conjunctivae and EOM are normal. Pupils are equal, round, and reactive to light. No scleral icterus.  Neck: Neck supple. No thyromegaly present.  Cardiovascular: Normal rate, regular rhythm, normal heart sounds and intact distal pulses.  No murmur heard. Pulmonary/Chest: Effort normal and breath sounds normal. No respiratory distress. He has no wheezes. He has no rales.  Musculoskeletal: Normal range of motion. He exhibits no edema.  Lymphadenopathy:    He has no cervical adenopathy.  Neurological: He is alert and oriented to person, place, and  time. Coordination normal.  Skin: Skin is warm and dry. No rash noted. He is not diaphoretic.  Psychiatric: He has a normal mood and affect. His behavior is normal.  Nursing note and vitals reviewed.       Assessment & Plan:   Problem List Items Addressed This Visit    None    Visit Diagnoses    Acute non-recurrent frontal sinusitis    -  Primary   Relevant Medications   fluticasone (FLONASE) 50 MCG/ACT nasal spray    azithromycin (ZITHROMAX) 250 MG tablet   predniSONE (DELTASONE) 20 MG tablet   Right acute serous otitis media, recurrence not specified       Relevant Medications   fluticasone (FLONASE) 50 MCG/ACT nasal spray   azithromycin (ZITHROMAX) 250 MG tablet   predniSONE (DELTASONE) 20 MG tablet       Follow up plan: Return if symptoms worsen or fail to improve.  Counseling provided for all of the vaccine components No orders of the defined types were placed in this encounter.   Arville CareJoshua Helga Asbury, MD [facility] 07/14/2017, 11:57 AM

## 2017-08-22 DIAGNOSIS — Z0189 Encounter for other specified special examinations: Secondary | ICD-10-CM | POA: Diagnosis not present

## 2017-08-22 DIAGNOSIS — K219 Gastro-esophageal reflux disease without esophagitis: Secondary | ICD-10-CM | POA: Diagnosis not present

## 2017-08-22 DIAGNOSIS — M79673 Pain in unspecified foot: Secondary | ICD-10-CM | POA: Diagnosis not present

## 2017-08-22 DIAGNOSIS — E785 Hyperlipidemia, unspecified: Secondary | ICD-10-CM | POA: Diagnosis not present

## 2017-08-22 DIAGNOSIS — M25569 Pain in unspecified knee: Secondary | ICD-10-CM | POA: Diagnosis not present

## 2017-10-12 DIAGNOSIS — H5213 Myopia, bilateral: Secondary | ICD-10-CM | POA: Diagnosis not present

## 2017-10-18 DIAGNOSIS — G44219 Episodic tension-type headache, not intractable: Secondary | ICD-10-CM | POA: Diagnosis not present

## 2017-10-21 DIAGNOSIS — H04123 Dry eye syndrome of bilateral lacrimal glands: Secondary | ICD-10-CM | POA: Diagnosis not present

## 2018-04-13 DIAGNOSIS — L74513 Primary focal hyperhidrosis, soles: Secondary | ICD-10-CM | POA: Diagnosis not present

## 2018-04-13 DIAGNOSIS — M7752 Other enthesopathy of left foot: Secondary | ICD-10-CM | POA: Diagnosis not present

## 2018-04-13 DIAGNOSIS — M7751 Other enthesopathy of right foot: Secondary | ICD-10-CM | POA: Diagnosis not present

## 2018-04-13 DIAGNOSIS — M722 Plantar fascial fibromatosis: Secondary | ICD-10-CM | POA: Diagnosis not present

## 2018-04-28 DIAGNOSIS — Z23 Encounter for immunization: Secondary | ICD-10-CM | POA: Diagnosis not present

## 2018-05-17 ENCOUNTER — Encounter: Payer: Self-pay | Admitting: Family Medicine

## 2018-05-17 ENCOUNTER — Ambulatory Visit (INDEPENDENT_AMBULATORY_CARE_PROVIDER_SITE_OTHER): Payer: BLUE CROSS/BLUE SHIELD | Admitting: Family Medicine

## 2018-05-17 VITALS — BP 130/76 | HR 76 | Temp 98.3°F | Ht 69.0 in | Wt 215.0 lb

## 2018-05-17 DIAGNOSIS — M722 Plantar fascial fibromatosis: Secondary | ICD-10-CM | POA: Diagnosis not present

## 2018-05-17 MED ORDER — PREDNISONE 10 MG (21) PO TBPK: ORAL_TABLET | ORAL | 0 refills | Status: DC

## 2018-05-17 NOTE — Patient Instructions (Signed)

## 2018-05-17 NOTE — Progress Notes (Signed)
Subjective: CC: Heel pain PCP: Elenora Gamma, MD WUJ:WJXBJYN Duane Murphy. is a 42 y.o. male presenting to clinic today for:  1. Heel pain Patient reports several month history of bilateral heel pain.  He reports that symptoms are worse in the morning when he goes to stepdown.  He is actually seen a specialist and was prescribed a special foot splint as well as orthotics but states that symptoms have not improved.  He has been doing some stretching at home but has not done any icing.  He has been taking diclofenac and felt that this initially did help but things have gotten worse.  He does report increased activity and has not been able to rest much.  In fact, he states that he works out several times per week and does kickboxing, which requires quite a bit of jumping up and down.  No sensation changes.   ROS: Per HPI  No Known Allergies Past Medical History:  Diagnosis Date  . GERD (gastroesophageal reflux disease)    No current outpatient medications on file. Social History   Socioeconomic History  . Marital status: Married    Spouse name: Not on file  . Number of children: Not on file  . Years of education: Not on file  . Highest education level: Not on file  Occupational History  . Not on file  Social Needs  . Financial resource strain: Not on file  . Food insecurity:    Worry: Not on file    Inability: Not on file  . Transportation needs:    Medical: Not on file    Non-medical: Not on file  Tobacco Use  . Smoking status: Former Smoker    Packs/day: 0.25    Types: Cigarettes    Start date: 08/04/1997    Last attempt to quit: 07/07/2011    Years since quitting: 6.8  . Smokeless tobacco: Never Used  Substance and Sexual Activity  . Alcohol use: Yes    Comment: rare  . Drug use: No  . Sexual activity: Yes  Lifestyle  . Physical activity:    Days per week: Not on file    Minutes per session: Not on file  . Stress: Not on file  Relationships  . Social  connections:    Talks on phone: Not on file    Gets together: Not on file    Attends religious service: Not on file    Active member of club or organization: Not on file    Attends meetings of clubs or organizations: Not on file    Relationship status: Not on file  . Intimate partner violence:    Fear of current or ex partner: Not on file    Emotionally abused: Not on file    Physically abused: Not on file    Forced sexual activity: Not on file  Other Topics Concern  . Not on file  Social History Narrative  . Not on file   Family History  Problem Relation Age of Onset  . Diabetes Mother   . Hyperlipidemia Mother   . Hypertension Mother   . Diabetes Father   . Hyperlipidemia Father   . Hypertension Father     Objective: Office vital signs reviewed. BP 130/76 (BP Location: Right Arm, Cuff Size: Normal)   Pulse 76   Temp 98.3 F (36.8 C) (Oral)   Ht 5\' 9"  (1.753 m)   Wt 215 lb (97.5 kg)   BMI 31.75 kg/m   Physical Examination:  General: Awake, alert, well nourished, No acute distress MSK:   Foot: Tenderness to palpation over the plantar aspect of the calcaneus bilaterally.  Left worse than right.  No palpable bony abnormalities.  No swelling or discoloration.  Patient has full active range of motion of bilateral feet. Neuro: Light touch sensation grossly intact.  Assessment/ Plan: 42 y.o. male   1. Plantar fasciitis, bilateral Persistent pain despite home stretching, use of oral NSAID.  I have given him a 6-day taper of prednisone.  We discussed seeing the podiatrist versus orthopedist for consideration of corticosteroid injection to the plantar fascia.  Patient wants to see a different podiatrist and is interested in seeing someone local.  Home care instruction reviewed with the patient.  I reinforced stretching and icing.  He will follow-up as needed. - Ambulatory referral to Podiatry   Orders Placed This Encounter  Procedures  . Ambulatory referral to Podiatry     Referral Priority:   Routine    Referral Type:   Consultation    Referral Reason:   Specialty Services Required    Requested Specialty:   Podiatry    Number of Visits Requested:   1   Meds ordered this encounter  Medications  . predniSONE (STERAPRED UNI-PAK 21 TAB) 10 MG (21) TBPK tablet    Sig: As directed x 6 days    Dispense:  21 tablet    Refill:  0     Maclane Holloran Hulen Skains, DO [facility] [phone]

## 2018-05-17 NOTE — Progress Notes (Signed)
2

## 2018-05-23 ENCOUNTER — Telehealth: Payer: Self-pay | Admitting: Family Medicine

## 2018-05-23 DIAGNOSIS — M722 Plantar fascial fibromatosis: Secondary | ICD-10-CM

## 2018-05-23 NOTE — Telephone Encounter (Signed)
Spoke to wife and states that he would like a referral for podiatrist for his plantar fasciitis.  Patient seen Dr. Reece Agar 10/23 and states it is no better- please advise and place referral if approved. Covering PCP

## 2018-05-23 NOTE — Telephone Encounter (Signed)
Place refer under Dr. Jeannett Senior name for the [facility], that way the notes and information do not some back to me

## 2018-05-24 NOTE — Telephone Encounter (Signed)
Wife aware, referral put in for Dr. Ulice Brilliant

## 2018-05-24 NOTE — Progress Notes (Signed)
Dr. Ulice Brilliant referral made, wife aware

## 2018-06-13 DIAGNOSIS — M79672 Pain in left foot: Secondary | ICD-10-CM | POA: Diagnosis not present

## 2018-06-13 DIAGNOSIS — M722 Plantar fascial fibromatosis: Secondary | ICD-10-CM | POA: Diagnosis not present

## 2018-06-13 DIAGNOSIS — M79671 Pain in right foot: Secondary | ICD-10-CM | POA: Diagnosis not present

## 2018-06-20 ENCOUNTER — Telehealth: Payer: Self-pay | Admitting: *Deleted

## 2018-06-20 NOTE — Telephone Encounter (Signed)
Pt got flu shot at work.

## 2018-07-25 DIAGNOSIS — M79671 Pain in right foot: Secondary | ICD-10-CM | POA: Diagnosis not present

## 2018-07-25 DIAGNOSIS — M722 Plantar fascial fibromatosis: Secondary | ICD-10-CM | POA: Diagnosis not present

## 2018-09-08 DIAGNOSIS — H5213 Myopia, bilateral: Secondary | ICD-10-CM | POA: Diagnosis not present

## 2018-09-21 ENCOUNTER — Encounter: Payer: Self-pay | Admitting: Family Medicine

## 2018-09-21 ENCOUNTER — Ambulatory Visit: Payer: BLUE CROSS/BLUE SHIELD | Admitting: Family Medicine

## 2018-09-21 VITALS — BP 127/82 | HR 86 | Temp 101.3°F | Ht 69.0 in | Wt 216.0 lb

## 2018-09-21 DIAGNOSIS — R6889 Other general symptoms and signs: Secondary | ICD-10-CM

## 2018-09-21 DIAGNOSIS — J020 Streptococcal pharyngitis: Secondary | ICD-10-CM

## 2018-09-21 LAB — VERITOR FLU A/B WAIVED
INFLUENZA B: NEGATIVE
Influenza A: NEGATIVE

## 2018-09-21 LAB — RAPID STREP SCREEN (MED CTR MEBANE ONLY): STREP GP A AG, IA W/REFLEX: POSITIVE — AB

## 2018-09-21 MED ORDER — PENICILLIN V POTASSIUM 500 MG PO TABS: 500.0000 mg | ORAL_TABLET | Freq: Three times a day (TID) | ORAL | 0 refills | Status: AC

## 2018-09-21 NOTE — Patient Instructions (Signed)
Strep Throat    Strep throat is a bacterial infection of the throat. Your health care provider may call the infection tonsillitis or pharyngitis, depending on whether there is swelling in the tonsils or at the back of the throat. Strep throat is most common during the cold months of the year in children who are 5-42 years of age, but it can happen during any season in people of any age. This infection is spread from person to person (contagious) through coughing, sneezing, or close contact.  What are the causes?  Strep throat is caused by the bacteria called Streptococcus pyogenes.  What increases the risk?  This condition is more likely to develop in:  · People who spend time in crowded places where the infection can spread easily.  · People who have close contact with someone who has strep throat.  What are the signs or symptoms?  Symptoms of this condition include:  · Fever or chills.  · Redness, swelling, or pain in the tonsils or throat.  · Pain or difficulty when swallowing.  · White or yellow spots on the tonsils or throat.  · Swollen, tender glands in the neck or under the jaw.  · Red rash all over the body (rare).  How is this diagnosed?  This condition is diagnosed by performing a rapid strep test or by taking a swab of your throat (throat culture test). Results from a rapid strep test are usually ready in a few minutes, but throat culture test results are available after one or two days.  How is this treated?  This condition is treated with antibiotic medicine.  Follow these instructions at home:  Medicines  · Take over-the-counter and prescription medicines only as told by your health care provider.  · Take your antibiotic as told by your health care provider. Do not stop taking the antibiotic even if you start to feel better.  · Have family members who also have a sore throat or fever tested for strep throat. They may need antibiotics if they have the strep infection.  Eating and drinking  · Do not  share food, drinking cups, or personal items that could cause the infection to spread to other people.  · If swallowing is difficult, try eating soft foods until your sore throat feels better.  · Drink enough fluid to keep your urine clear or pale yellow.  General instructions  · Gargle with a salt-water mixture 3-4 times per day or as needed. To make a salt-water mixture, completely dissolve ½-1 tsp of salt in 1 cup of warm water.  · Make sure that all household members wash their hands well.  · Get plenty of rest.  · Stay home from school or work until you have been taking antibiotics for 24 hours.  · Keep all follow-up visits as told by your health care provider. This is important.  Contact a health care provider if:  · The glands in your neck continue to get bigger.  · You develop a rash, cough, or earache.  · You cough up a thick liquid that is green, yellow-brown, or bloody.  · You have pain or discomfort that does not get better with medicine.  · Your problems seem to be getting worse rather than better.  · You have a fever.  Get help right away if:  · You have new symptoms, such as vomiting, severe headache, stiff or painful neck, chest pain, or shortness of breath.  · You have severe throat   pain, drooling, or changes in your voice.  · You have swelling of the neck, or the skin on the neck becomes red and tender.  · You have signs of dehydration, such as fatigue, dry mouth, and decreased urination.  · You become increasingly sleepy, or you cannot wake up completely.  · Your joints become red or painful.  This information is not intended to replace advice given to you by your health care provider. Make sure you discuss any questions you have with your health care provider.  Document Released: 07/09/2000 Document Revised: 03/10/2016 Document Reviewed: 11/04/2014  Elsevier Interactive Patient Education © 2019 [employer]

## 2018-09-21 NOTE — Progress Notes (Signed)
    Duane Murphy. is a 42 y.o. male presenting with a sore throat for 3 days.  Associated symptoms include:  fever, chills, headache and muscle aches.  Symptoms are progressively worsening.  Home treatment thus far includes:  rest, hydration and NSAIDS/acetaminophen.  Known sick contacts with similar symptoms.  There is no history of of similar symptoms.  History, medications, and allergies reviewed.   Review of Systems  Constitutional: Positive for chills, fever and malaise/fatigue.  HENT: Positive for sore throat.   Respiratory: Negative for cough, sputum production and shortness of breath.   Cardiovascular: Negative for chest pain and palpitations.  Musculoskeletal: Positive for myalgias.  Neurological: Positive for headaches.  All other systems reviewed and are negative.    Exam:  BP 127/82   Pulse 86   Temp (!) 101.3 F (38.5 C) (Oral)   Ht 5\' 9"  (1.753 m)   Wt 216 lb (98 kg)   BMI 31.90 kg/m  Physical Exam  Constitutional: He is oriented to person, place, and time. Vital signs are normal. He appears healthy.  Non-toxic appearance. He appears distressed (mild).  HENT:  Head: Normocephalic and atraumatic.  Right Ear: Hearing, tympanic membrane, external ear and ear canal normal.  Left Ear: Hearing, tympanic membrane, external ear and ear canal normal.  Nose: Nose normal.  Mouth/Throat: Uvula is midline. Oropharyngeal exudate, posterior oropharyngeal edema and posterior oropharyngeal erythema present. No tonsillar abscesses.  Tonsils 3+ bilaterally with white patches  Cardiovascular: Normal rate and regular rhythm. Exam reveals no gallop and no friction rub.  No murmur heard. Pulmonary/Chest: Effort normal and breath sounds normal. No respiratory distress.  Neurological: He is alert and oriented to person, place, and time.  Skin: Skin is warm and dry. No rash noted. No erythema. No pallor.  Psychiatric: Mood, memory, affect and judgment normal.   Rapid strep  positive in office Influenza swab negative in office  Assessment and Plan  Rishabh was seen today for fever, chills, fatigue, sore throat, headache.  Diagnoses and all orders for this visit:  Flu-like symptoms Negative for influenza. Symptomatic care discussed.  -     Veritor Flu A/B Waived  Strep sore throat Symptomatic care discussed. Warm salt water gargles, lozenges if beneficial, tylenol as needed for pain and fever control, medications as prescribed. Report any new or worsening symptoms.  -     Rapid Strep Screen ([facility] ONLY) -     penicillin v potassium (VEETID) 500 MG tablet; Take 1 tablet (500 mg total) by mouth 3 (three) times daily for 10 days.  Return if symptoms worsen or fail to improve.  The above assessment and management plan was discussed with the patient. The patient verbalized understanding of and has agreed to the management plan. Patient is aware to call the clinic if symptoms fail to improve or worsen. Patient is aware when to return to the clinic for a follow-up visit. Patient educated on when it is appropriate to go to the emergency department.   Kari Baars, FNP-C [facility] [address] [place], [place] [address] [phone]

## 2018-10-02 ENCOUNTER — Ambulatory Visit: Payer: BLUE CROSS/BLUE SHIELD | Admitting: Family Medicine

## 2018-10-02 ENCOUNTER — Encounter: Payer: Self-pay | Admitting: Family Medicine

## 2018-10-02 VITALS — BP 153/86 | HR 116 | Temp 102.7°F | Ht 69.0 in | Wt 214.0 lb

## 2018-10-02 DIAGNOSIS — J101 Influenza due to other identified influenza virus with other respiratory manifestations: Secondary | ICD-10-CM | POA: Diagnosis not present

## 2018-10-02 LAB — VERITOR FLU A/B WAIVED
INFLUENZA A: POSITIVE — AB
Influenza B: NEGATIVE

## 2018-10-02 MED ORDER — OSELTAMIVIR PHOSPHATE 75 MG PO CAPS: 75.0000 mg | ORAL_CAPSULE | Freq: Two times a day (BID) | ORAL | 0 refills | Status: DC

## 2018-10-02 NOTE — Progress Notes (Signed)
BP (!) 153/86   Pulse (!) 116   Temp (!) 102.7 F (39.3 C) (Oral)   Ht 5\' 9"  (1.753 m)   Wt 214 lb (97.1 kg)   BMI 31.60 kg/m    Subjective:    Patient ID: Duane Murphy., male    DOB: [birth year], 43 y.o.   MRN: [id number]  HPI: Ramal Mckethan. is a 43 y.o. male presenting on 10/02/2018 for sneezing (x 1 day. Patient is still taking strep medication - seen 2/27); Fever (102); Chills; Generalized Body Aches; and Fatigue   HPI Cough and congestion and body aches and fever Patient was here just over a week ago on 09/21/2018 and treated for strep throat and he had symptoms from then and is been taking the amoxicillin since then and is still taking it.  He says he was doing a lot better on the amoxicillin until the past day and a half when he started having fevers and body aches and cough and congestion that has been increasing.  He denies any shortness of breath but his body aches have been severe and his fevers have been up to 102.7 which is here in the office.  He has been using Tylenol cold and flu and still taking the amoxicillin for previous.  Patient just generally feels ill and down and feels like his fever is gotten worse over the past couple days.  Relevant past medical, surgical, family and social history reviewed and updated as indicated. Interim medical history since our last visit reviewed. Allergies and medications reviewed and updated.  Review of Systems  Constitutional: Positive for chills and fever.  HENT: Positive for congestion, postnasal drip, rhinorrhea, sinus pressure, sneezing and sore throat. Negative for ear discharge, ear pain and voice change.   Eyes: Negative for pain, discharge, redness and visual disturbance.  Respiratory: Positive for cough. Negative for chest tightness, shortness of breath and wheezing.   Cardiovascular: Negative for chest pain and leg swelling.  Gastrointestinal: Negative for abdominal pain, constipation and diarrhea.   Genitourinary: Negative for difficulty urinating.  Musculoskeletal: Positive for myalgias. Negative for back pain and gait problem.  Skin: Negative for rash.  Neurological: Negative for syncope, light-headedness and headaches.  All other systems reviewed and are negative.   Per HPI unless specifically indicated above     Objective:    BP (!) 153/86   Pulse (!) 116   Temp (!) 102.7 F (39.3 C) (Oral)   Ht 5\' 9"  (1.753 m)   Wt 214 lb (97.1 kg)   BMI 31.60 kg/m   Wt Readings from Last 3 Encounters:  10/02/18 214 lb (97.1 kg)  09/21/18 216 lb (98 kg)  05/17/18 215 lb (97.5 kg)    Physical Exam Vitals signs and nursing note reviewed.  Constitutional:      General: He is not in acute distress.    Appearance: He is well-developed. He is not diaphoretic.  HENT:     Right Ear: Tympanic membrane, ear canal and external ear normal.     Left Ear: Tympanic membrane, ear canal and external ear normal.     Nose: Mucosal edema and rhinorrhea present.     Right Sinus: Maxillary sinus tenderness present. No frontal sinus tenderness.     Left Sinus: Maxillary sinus tenderness present. No frontal sinus tenderness.     Mouth/Throat:     Pharynx: Uvula midline. Posterior oropharyngeal erythema present. No oropharyngeal exudate.     Tonsils: No tonsillar abscesses.  Eyes:  General: No scleral icterus.       Right eye: No discharge.     Conjunctiva/sclera: Conjunctivae normal.     Pupils: Pupils are equal, round, and reactive to light.  Neck:     Musculoskeletal: Neck supple.     Thyroid: No thyromegaly.  Cardiovascular:     Rate and Rhythm: Normal rate and regular rhythm.     Heart sounds: Normal heart sounds. No murmur.  Pulmonary:     Effort: Pulmonary effort is normal. No respiratory distress.     Breath sounds: Normal breath sounds. No wheezing or rales.  Musculoskeletal: Normal range of motion.  Lymphadenopathy:     Cervical: No cervical adenopathy.  Skin:    General: Skin  is warm and dry.     Findings: No rash.  Neurological:     Mental Status: He is alert and oriented to person, place, and time.     Coordination: Coordination normal.  Psychiatric:        Behavior: Behavior normal.     Rapid flu a positive    Assessment & Plan:   Problem List Items Addressed This Visit    None    Visit Diagnoses    Influenza A    -  Primary   Relevant Medications   oseltamivir (TAMIFLU) 75 MG capsule   Other Relevant Orders   Veritor Flu A/B Waived       Follow up plan: Return if symptoms worsen or fail to improve.  Counseling provided for all of the vaccine components Orders Placed This Encounter  Procedures  . Veritor Flu A/B Waived    Arville Care, MD [facility] 10/02/2018, 4:03 PM

## 2019-10-16 ENCOUNTER — Telehealth (INDEPENDENT_AMBULATORY_CARE_PROVIDER_SITE_OTHER): Payer: 59 | Admitting: Family Medicine

## 2019-10-16 ENCOUNTER — Encounter: Payer: Self-pay | Admitting: Family Medicine

## 2019-10-16 DIAGNOSIS — L237 Allergic contact dermatitis due to plants, except food: Secondary | ICD-10-CM

## 2019-10-16 MED ORDER — PREDNISONE 10 MG (21) PO TBPK: ORAL_TABLET | ORAL | 0 refills | Status: DC

## 2019-10-16 NOTE — Patient Instructions (Signed)
Poison Oak Dermatitis  Poison oak dermatitis is redness and soreness (inflammation) of the skin caused by chemicals in the leaves of the poison oak plant. You may have very bad itching, swelling, a rash, and blisters. What are the causes? You may get this condition by:  Touching a poison oak plant.  Touching something that has the chemical from the leaves on it. This may include animals or objects that have come in contact with the plant. What increases the risk? You are more likely to get this condition if you:  Go outdoors often in wooded or marshy areas.  Go outdoors without wearing protective clothing, such as closed shoes, long pants, and a long-sleeved shirt. What are the signs or symptoms? Symptoms of this condition include:  Redness of the skin.  Very bad itching.  A rash that often includes bumps and blisters. ? The rash usually appears 48 hours after exposure if you have been exposed before. ? If this is the first time you have been exposed, the rash may not appear until a week after exposure.  Swelling. This may occur if the reaction is very bad. Symptoms often clear up in 1-2 weeks. The first time you develop this condition, symptoms may last 3-4 weeks. How is this treated? This condition may be treated with:  Hydrocortisone creams or calamine lotions to help with itching.  Oatmeal baths to soothe the skin.  Medicines to help reduce itching (antihistamines). If you have a very bad reaction, you may also be given steroid medicines. Follow these instructions at home: Medicines  Take or apply over-the-counter and prescription medicines only as told by your doctor.  Use hydrocortisone creams or calamine lotion as needed to help with itching. General instructions  Do not scratch or rub your skin.  Put a cold, wet cloth (cold compress) on the affected areas or take baths in cool water. This will help with itching.  Avoid hot baths and showers.  Take oatmeal  baths as needed. Use colloidal oatmeal. You can get this at a pharmacy or grocery store. Follow the instructions on the package.  While you have the rash, wash your clothes right after you wear them.  Keep all follow-up visits as told by your doctor. This is important. How is this prevented?   Know what poison oak looks like so you can avoid it. ? This plant has three leaves with flowering branches on a single stem. ? The leaves are fuzzy. ? The edges of the leaves look like teeth.  If you have touched poison oak, wash your skin with soap and water right away. Be sure to wash under your fingernails.  When hiking or camping, wear long pants, a long-sleeved shirt, tall socks, and hiking boots. You can also use a lotion on your skin that helps to prevent contact with the chemical on the plant.  If you think that your clothes or outdoor gear came in contact with poison oak, rinse them off with a garden hose before you bring them inside your house.  When doing yard work or gardening, wear gloves, long sleeves, long pants, and boots. Wash your garden tools and gloves if they come in contact with poison oak.  If you think that your pet has come into contact with poison oak, wash him or her with pet shampoo and water. Make sure you wear gloves while washing your pet.  Do not burn poison oak plants. This can release the chemical from the plant into the air and   may cause a reaction. Contact a doctor if:  You have open sores in the rash area.  You have more redness, swelling, or pain in the affected area.  You have redness that spreads beyond the rash area.  You have fluid, blood, or pus coming from the affected area.  You have a fever.  You have a rash over a large area of your body.  You have a rash on your eyes, mouth, or genitals.  Your rash does not improve after a few weeks. Get help right away if:  Your face swells or your eyes swell shut.  You have trouble breathing.  You  have trouble swallowing. These symptoms may be an emergency. Do not wait to see if the symptoms will go away. Get medical help right away. Call your local emergency services (911 in the [place] Do not drive yourself to the hospital. Summary  Poison oak dermatitis is redness and soreness of the skin caused by chemicals in the leaves of the poison oak plant.  Symptoms of this condition include redness, very bad itching, a rash, and swelling.  Do not scratch or rub your skin.  Take or apply over-the-counter and prescription medicines only as told by your doctor. This information is not intended to replace advice given to you by your health care provider. Make sure you discuss any questions you have with your health care provider. Document Revised: 11/03/2018 Document Reviewed: 08/11/2018 Elsevier Patient Education  2020 [employer]vier Inc[employer]  

## 2019-10-16 NOTE — Progress Notes (Signed)
MyChart Video visit  Subjective: CC: ?poison oak PCP: Raliegh Ip, DO VEH:MCNOBSJ Duane Murphy. is a 44 y.o. male calls for video consult today. Patient provides verbal consent for consult held via video.  Due to COVID-19 pandemic this visit was conducted virtually. This visit type was conducted due to national recommendations for restrictions regarding the COVID-19 Pandemic (e.g. social distancing, sheltering in place) in an effort to limit this patient's exposure and mitigate transmission in our community. All issues noted in this document were discussed and addressed.  A physical exam was not performed with this format.   Location of patient: home Location of provider: Working remotely from home Others present for call: wife  1. ?Poison oak Patient reports that he was working in the yard a couple of days ago and may have come in contact with some poison oak.  He did feel that he took appropriate precautions by wearing long sleeves and gloves but unfortunately he started working out on bilateral arms extending up into the elbows.  The right upper extremity seems to be a little bit more swollen the left.  The rash is diffusely itchy and mildly erythematous.  No skin breakdown.  No oral swelling.  No lesions on the face, groin or axilla.  No shortness of breath.  He has taken an OTC antihistamine used topical benadryl with little improvement in symptoms.   ROS: Per HPI  No Known Allergies Past Medical History:  Diagnosis Date  . GERD (gastroesophageal reflux disease)     Current Outpatient Medications:  None  Gen: well appearing male, NAD HEENT: no facial swelling Pulmonary: normal work of breathing on room air. No dyspnea with speech Skin: Mildly erythematous raised, maculopapular rash noted along bilateral upper extremities regarding left.  Assessment/ Plan: 44 y.o. male   1. Allergic dermatitis due to poison oak Continue oral antihistamine.  Given nature of current work  and frequent hand washing, I have given him an oral corticosteroid to use for this dermatitis rather than a topical as I worry that he will wash the topical off.  Okay to continue Benadryl if needed.  We discussed red flag signs symptoms warranting further evaluation.  He voiced an understanding of all. - predniSONE (STERAPRED UNI-PAK 21 TAB) 10 MG (21) TBPK tablet; As directed x 6 days  Dispense: 21 tablet; Refill: 0   Start time: 1:56pm End time: 2:02pm  Total time spent on patient care (including telephone call/ virtual visit): 16 minutes  Scarlet Abad Hulen Skains, DO [facility] [phone]

## 2019-10-22 ENCOUNTER — Encounter: Payer: Self-pay | Admitting: Nurse Practitioner

## 2019-10-22 ENCOUNTER — Telehealth (INDEPENDENT_AMBULATORY_CARE_PROVIDER_SITE_OTHER): Payer: 59 | Admitting: Nurse Practitioner

## 2019-10-22 ENCOUNTER — Other Ambulatory Visit: Payer: Self-pay

## 2019-10-22 DIAGNOSIS — L237 Allergic contact dermatitis due to plants, except food: Secondary | ICD-10-CM

## 2019-10-22 MED ORDER — DESOXIMETASONE 0.25 % EX CREA: 1.0000 "application " | TOPICAL_CREAM | Freq: Two times a day (BID) | CUTANEOUS | 0 refills | Status: DC

## 2019-10-22 MED ORDER — PREDNISONE 10 MG (21) PO TBPK: ORAL_TABLET | ORAL | 0 refills | Status: DC

## 2019-10-22 NOTE — Progress Notes (Signed)
Virtual Visit via video Note   Due to COVID-19 pandemic this visit was conducted virtually. This visit type was conducted due to national recommendations for restrictions regarding the COVID-19 Pandemic (e.g. social distancing, sheltering in place) in an effort to limit this patient's exposure and mitigate transmission in our community. All issues noted in this document were discussed and addressed.  A physical exam was not performed with this format.  I connected with Duane Murphy on 10/22/19 at 8:55 by video and verified that I am speaking with the correct person using two identifiers. Duane Murphy. is currently located  in his car and no one is currently with him during visit. The provider, Mary-Margaret Daphine Deutscher, FNP is located in their office at time of visit.  I discussed the limitations, risks, security and privacy concerns of performing an evaluation and management service by telephone and the availability of in person appointments. I also discussed with the patient that there may be a patient responsible charge related to this service. The patient expressed understanding and agreed to proceed.   History and Present Illness:   Chief Complaint: poison oak  HPI He had a video visit with Duane Murphy on 10/16/19. He had poison Oak rash on bil arms. The steroid helped some but did not reslove. Yesterday it started to return and is creeping up arms again. He is severly allergic to this.  Review of Systems  Constitutional: Negative.   HENT: Negative.   Respiratory: Negative.   Cardiovascular: Negative.   Gastrointestinal: Negative.   Genitourinary: Negative.   Skin: Negative.   Neurological: Negative.   Psychiatric/Behavioral: Negative.   All other systems reviewed and are negative.      Observations/Objective: Alert and oriented- answers all questions appropriately No distress Erythematous vesicualr ras on bil wrists and creeping up arms  bilaterally.   Assessment and Plan: Duane Murphy. in today with chief complaint of poison oak  1. Allergic dermatitis due to poison oak Avoid itching Cool compresses  Meds ordered this encounter  Medications  . predniSONE (STERAPRED UNI-PAK 21 TAB) 10 MG (21) TBPK tablet    Sig: As directed x 6 days    Dispense:  21 tablet    Refill:  0    Order Specific Question:   Supervising Provider    Answer:   Arville Care A [id number]  . desoximetasone (TOPICORT) 0.25 % cream    Sig: Apply 1 application topically 2 (two) times daily.    Dispense:  100 g    Refill:  0    Order Specific Question:   Supervising Provider    Answer:   Arville Care A [[id number]]       Follow Up Instructions: prn    I discussed the assessment and treatment plan with the patient. The patient was provided an opportunity to ask questions and all were answered. The patient agreed with the plan and demonstrated an understanding of the instructions.   The patient was advised to call back or seek an in-person evaluation if the symptoms worsen or if the condition fails to improve as anticipated.  The above assessment and management plan was discussed with the patient. The patient verbalized understanding of and has agreed to the management plan. Patient is aware to call the clinic if symptoms persist or worsen. Patient is aware when to return to the clinic for a follow-up visit. Patient educated on when it is appropriate to go to the emergency department.   Time call ended: 9:10  I provided 15 minutes of face-to-face time during this encounter.    Mary-Margaret Hassell Done, FNP

## 2020-07-10 ENCOUNTER — Telehealth: Payer: Self-pay

## 2020-07-10 ENCOUNTER — Ambulatory Visit: Payer: 59 | Admitting: Family Medicine

## 2020-07-10 ENCOUNTER — Ambulatory Visit: Payer: 59 | Admitting: Family

## 2020-07-11 ENCOUNTER — Ambulatory Visit: Payer: 59 | Admitting: Family

## 2020-09-08 ENCOUNTER — Ambulatory Visit (INDEPENDENT_AMBULATORY_CARE_PROVIDER_SITE_OTHER): Payer: BC Managed Care – PPO | Admitting: Family Medicine

## 2020-09-08 ENCOUNTER — Encounter: Payer: Self-pay | Admitting: Family Medicine

## 2020-09-08 ENCOUNTER — Other Ambulatory Visit: Payer: Self-pay

## 2020-09-08 VITALS — BP 127/80 | HR 88 | Temp 98.1°F | Ht 69.0 in | Wt 218.0 lb

## 2020-09-08 DIAGNOSIS — E782 Mixed hyperlipidemia: Secondary | ICD-10-CM | POA: Diagnosis not present

## 2020-09-08 DIAGNOSIS — Z0001 Encounter for general adult medical examination with abnormal findings: Secondary | ICD-10-CM

## 2020-09-08 DIAGNOSIS — R5383 Other fatigue: Secondary | ICD-10-CM

## 2020-09-08 DIAGNOSIS — Z125 Encounter for screening for malignant neoplasm of prostate: Secondary | ICD-10-CM | POA: Diagnosis not present

## 2020-09-08 DIAGNOSIS — Z0000 Encounter for general adult medical examination without abnormal findings: Secondary | ICD-10-CM

## 2020-09-08 DIAGNOSIS — K2100 Gastro-esophageal reflux disease with esophagitis, without bleeding: Secondary | ICD-10-CM

## 2020-09-08 NOTE — Patient Instructions (Signed)
Come in for fasting labs.   Helicobacter Pylori Infection Helicobacter pylori infection is a bacterial infection in the stomach. Long-term (chronic) infection can cause stomach irritation (gastritis), ulcers in the stomach (gastric ulcers), and ulcers in the upper part of the intestine (duodenal ulcers). Having this infection may also increase your risk of stomach cancer and a type of white blood cell cancer (lymphoma) that affects the stomach. What are the causes? This infection is caused by the Helicobacter pylori (H. pylori) bacteria. Many healthy people have this bacteria in their stomach lining. The bacteria may also spread from person to person through contact with stool (feces) or saliva. It is not known why some people develop ulcers, gastritis, or cancer from the bacteria. What increases the risk? You are more likely to develop this condition if you:  Have family members with the infection.  Live with many other people, such as in a dormitory.  Are of African, Hispanic, or Asian descent. What are the signs or symptoms? Most people with this infection do not have any symptoms. If you do have symptoms, they may include:  Heartburn.  Stomach pain.  Nausea.  Vomiting. The vomit may be bloody because of ulcers.  Loss of appetite.  Bad breath. How is this diagnosed? This condition may be diagnosed based on:  Your symptoms and medical history.  A physical exam.  Blood tests.  Stool tests.  A breath test.  A procedure that involves placing a tube with a camera on the end of it down your throat to examine your stomach and upper intestine (upper endoscopy).  Removing and testing a tissue sample from the stomach lining (biopsy). A biopsy may be taken during an upper endoscopy. How is this treated? This condition is treated by taking a combination of medicines (triple therapy) for several weeks. Triple therapy includes one medicine to reduce the amount of acid in your  stomach and two types of antibiotic medicines. This treatment may reduce your risk of cancer. You may need to be tested for H. pylori again after treatment. In some cases, the treatment may need to be repeated if your treatment did not get rid of all the bacteria.   Follow these instructions at home:  Take over-the-counter and prescription medicines only as told by your health care provider.  Take your antibiotics as told by your health care provider. Do not stop taking the antibiotics even if you start to feel better.  Return to your normal activities as told by your health care provider. Ask your health care provider what activities are safe for you.  Take steps to prevent future infections: ? Wash your hands often with soap and water. If soap and water are not available, use hand sanitizer. ? Do not eat food or drink water that may have had contact with stool or saliva.  Keep all follow-up visits as told by your health care provider. This is important. You may need tests to make sure your treatment worked.   Contact a health care provider if your symptoms:  Do not get better with treatment.  Return after treatment. Summary  Helicobacter pylori infection is a stomach infection caused by the Helicobacter pylori (H. pylori) bacteria.  This infection can cause stomach irritation (gastritis), ulcers in the stomach (gastric ulcers), and ulcers in the upper part of the intestine (duodenal ulcers).  This condition is treated by taking a combination of medicines (triple therapy) for several weeks.  Take your antibiotics as told by your health care  provider. Do not stop taking the antibiotics even if you start to feel better. This information is not intended to replace advice given to you by your health care provider. Make sure you discuss any questions you have with your health care provider. Document Revised: 11/02/2018 Document Reviewed: 07/05/2017 Elsevier Patient Education  2021  [employer].

## 2020-09-08 NOTE — Progress Notes (Signed)
Duane Murphy. is a 45 y.o. male presents to office today for annual physical exam examination.    Concerns today include: None   Occupation: School system, Marital status: married, Substance use: none Diet: fair, Exercise: no structured since COVID Last eye exam: UTD Last dental exam: UTD Last colonoscopy: n/a Refills needed today: none Immunizations needed: had 1st dose of COVID vaccine but not sure of date Immunization History  Administered Date(s) Administered  . Influenza,inj,Quad PF,6+ Mos 07/06/2013, 05/23/2014, 04/22/2017     Past Medical History:  Diagnosis Date  . GERD (gastroesophageal reflux disease)    Social History   Socioeconomic History  . Marital status: Married    Spouse name: Not on file  . Number of children: Not on file  . Years of education: Not on file  . Highest education level: Not on file  Occupational History  . Not on file  Tobacco Use  . Smoking status: Former Smoker    Packs/day: 0.25    Types: Cigarettes    Start date: 08/04/1997    Quit date: 07/07/2011    Years since quitting: 9.1  . Smokeless tobacco: Never Used  Vaping Use  . Vaping Use: Never used  Substance and Sexual Activity  . Alcohol use: Yes    Comment: rare  . Drug use: No  . Sexual activity: Yes  Other Topics Concern  . Not on file  Social History Narrative  . Not on file   Social Determinants of Health   Financial Resource Strain: Not on file  Food Insecurity: Not on file  Transportation Needs: Not on file  Physical Activity: Not on file  Stress: Not on file  Social Connections: Not on file  Intimate Partner Violence: Not on file   Past Surgical History:  Procedure Laterality Date  . APPENDECTOMY     Family History  Problem Relation Age of Onset  . Diabetes Mother   . Hyperlipidemia Mother   . Hypertension Mother   . Diabetes Father   . Hyperlipidemia Father   . Hypertension Father     Current Outpatient Medications:  .  desoximetasone  (TOPICORT) 0.25 % cream, Apply 1 application topically 2 (two) times daily., Disp: 100 g, Rfl: 0  No Known Allergies   ROS: Review of Systems A comprehensive review of systems was negative except for: Eyes: positive for contacts/glasses Ears, nose, mouth, throat, and face: positive for esophageal reflux Respiratory: positive for change in exercise tolerance. more easily winded Gastrointestinal: positive for dyspepsia Musculoskeletal: positive for left ankle weakness/ pain. (chronic)    Physical exam BP 127/80   Pulse 88   Temp 98.1 F (36.7 C) (Temporal)   Ht '5\' 9"'  (1.753 m)   Wt 218 lb (98.9 kg)   SpO2 96%   BMI 32.19 kg/m  General appearance: alert, cooperative, appears stated age and no distress Head: Normocephalic, without obvious abnormality, atraumatic Eyes: negative findings: lids and lashes normal, conjunctivae and sclerae normal, corneas clear and pupils equal, round, reactive to light and accomodation Ears: normal TM's and external ear canals both ears Nose: Nares normal. Septum midline. Mucosa normal. No drainage or sinus tenderness. Throat: lips, mucosa, and tongue normal; teeth and gums normal Neck: no adenopathy, supple, symmetrical, trachea midline and thyroid not enlarged, symmetric, no tenderness/mass/nodules Back: symmetric, no curvature. ROM normal. No CVA tenderness. Lungs: clear to auscultation bilaterally Chest wall: no tenderness Heart: regular rate and rhythm, S1, S2 normal, no murmur, click, rub or gallop Abdomen: soft, non-tender; bowel  sounds normal; no masses,  no organomegaly Extremities: extremities normal, atraumatic, no cyanosis or edema Pulses: 2+ and symmetric Skin: Skin color, texture, turgor normal. No rashes or lesions or skin tag noted on right side of neck Lymph nodes: Cervical, supraclavicular, and axillary nodes normal. Neurologic: Alert and oriented X 3, normal strength and tone. Normal symmetric reflexes. Normal coordination and  gait Psych: mood stable.    Assessment/ Plan: Duane Murphy. here for annual physical exam.   Annual physical exam  Screening for malignant neoplasm of prostate - Plan: PSA  Mixed hyperlipidemia - Plan: CMP14+EGFR, Lipid panel, TSH  Fatigue, unspecified type - Plan: Anemia panel  Gastroesophageal reflux disease with esophagitis without hemorrhage  Healthy exam.  Has gained some weight since last visit.  Working on lifestyle modification.  Has had some change in exercise tolerance since having Covid last September.  This does limit some of his physical activity  Will come in for fasting lipid panel, TSH and CMP  Has also had some ongoing fatigue since COVID-19.  Would like laboratory eval for this to make sure that everything is going okay  Ongoing GERD.  Not evaluated by gastroenterology.  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  Gave handout on H Pylori and encouraged him to be checked for this.   Counseled on healthy lifestyle choices, including diet (rich in fruits, vegetables and lean meats and low in salt and simple carbohydrates) and exercise (at least 30 minutes of moderate physical activity daily).  Patient to follow up in 1 year for annual exam or sooner if needed.  Amiaya Mcneeley M. Lajuana Ripple, DO

## 2020-09-12 ENCOUNTER — Other Ambulatory Visit: Payer: BC Managed Care – PPO

## 2020-09-12 ENCOUNTER — Other Ambulatory Visit: Payer: Self-pay

## 2020-09-12 DIAGNOSIS — E782 Mixed hyperlipidemia: Secondary | ICD-10-CM

## 2020-09-12 DIAGNOSIS — Z125 Encounter for screening for malignant neoplasm of prostate: Secondary | ICD-10-CM

## 2020-09-12 DIAGNOSIS — R5383 Other fatigue: Secondary | ICD-10-CM

## 2020-09-14 LAB — ANEMIA PANEL
Ferritin: 320 ng/mL (ref 30–400)
Folate, Hemolysate: 455 ng/mL
Folate, RBC: 978 ng/mL (ref >498)
Hematocrit: 46.5 % (ref 37.5–51.0)
Iron Saturation: 40 % (ref 15–55)
Iron: 127 ug/dL (ref 38–169)
Retic Ct Pct: 1.6 % (ref 0.6–2.6)
Total Iron Binding Capacity: 319 ug/dL (ref 250–450)
UIBC: 192 ug/dL (ref 111–343)
Vitamin B-12: 589 pg/mL (ref 232–1245)

## 2020-09-14 LAB — TSH: TSH: 1.38 u[IU]/mL (ref 0.450–4.500)

## 2020-09-14 LAB — CMP14+EGFR
ALT: 71 IU/L — ABNORMAL HIGH (ref 0–44)
AST: 40 IU/L (ref 0–40)
Albumin/Globulin Ratio: 1.6 (ref 1.2–2.2)
Albumin: 4.9 g/dL (ref 4.0–5.0)
Alkaline Phosphatase: 85 IU/L (ref 44–121)
BUN/Creatinine Ratio: 11 (ref 9–20)
BUN: 10 mg/dL (ref 6–24)
Bilirubin Total: 0.5 mg/dL (ref 0.0–1.2)
CO2: 23 mmol/L (ref 20–29)
Calcium: 9.8 mg/dL (ref 8.7–10.2)
Chloride: 100 mmol/L (ref 96–106)
Creatinine, Ser: 0.88 mg/dL (ref 0.76–1.27)
GFR calc Af Amer: 121 mL/min/{1.73_m2} (ref >59)
GFR calc non Af Amer: 104 mL/min/{1.73_m2} (ref >59)
Globulin, Total: 3.1 g/dL (ref 1.5–4.5)
Glucose: 121 mg/dL — ABNORMAL HIGH (ref 65–99)
Potassium: 4.5 mmol/L (ref 3.5–5.2)
Sodium: 137 mmol/L (ref 134–144)
Total Protein: 8 g/dL (ref 6.0–8.5)

## 2020-09-14 LAB — LIPID PANEL
Chol/HDL Ratio: 6.4 ratio — ABNORMAL HIGH (ref 0.0–5.0)
Cholesterol, Total: 236 mg/dL — ABNORMAL HIGH (ref 100–199)
HDL: 37 mg/dL — ABNORMAL LOW (ref >39)
LDL Chol Calc (NIH): 158 mg/dL — ABNORMAL HIGH (ref 0–99)
Triglycerides: 223 mg/dL — ABNORMAL HIGH (ref 0–149)
VLDL Cholesterol Cal: 41 mg/dL — ABNORMAL HIGH (ref 5–40)

## 2020-09-14 LAB — PSA: Prostate Specific Ag, Serum: 0.1 ng/mL (ref 0.0–4.0)

## 2020-09-16 LAB — SPECIMEN STATUS REPORT

## 2020-09-16 LAB — HGB A1C W/O EAG: Hgb A1c MFr Bld: 6 % — ABNORMAL HIGH (ref 4.8–5.6)

## 2020-09-24 ENCOUNTER — Ambulatory Visit: Payer: Self-pay

## 2021-04-10 ENCOUNTER — Other Ambulatory Visit: Payer: Self-pay

## 2021-04-10 ENCOUNTER — Encounter: Payer: Self-pay | Admitting: Nurse Practitioner

## 2021-04-10 ENCOUNTER — Ambulatory Visit (INDEPENDENT_AMBULATORY_CARE_PROVIDER_SITE_OTHER): Payer: Self-pay | Admitting: Nurse Practitioner

## 2021-04-10 DIAGNOSIS — Z024 Encounter for examination for driving license: Secondary | ICD-10-CM

## 2021-04-10 LAB — URINALYSIS
Bilirubin, UA: NEGATIVE
Glucose, UA: NEGATIVE
Ketones, UA: NEGATIVE
Leukocytes,UA: NEGATIVE
Nitrite, UA: NEGATIVE
Protein,UA: NEGATIVE
RBC, UA: NEGATIVE
Specific Gravity, UA: >1.03 — ABNORMAL HIGH (ref 1.005–1.030)
Urobilinogen, Ur: 0.2 mg/dL (ref 0.2–1.0)
pH, UA: 5.5 (ref 5.0–7.5)

## 2021-04-10 NOTE — Progress Notes (Signed)
Private DOT- see scanned in document 

## 2021-11-18 ENCOUNTER — Ambulatory Visit (INDEPENDENT_AMBULATORY_CARE_PROVIDER_SITE_OTHER): Payer: BC Managed Care – PPO | Admitting: Family Medicine

## 2021-11-18 ENCOUNTER — Encounter: Payer: Self-pay | Admitting: Family Medicine

## 2021-11-18 VITALS — BP 126/79 | HR 70 | Temp 98.0°F | Ht 69.0 in | Wt 218.4 lb

## 2021-11-18 DIAGNOSIS — G479 Sleep disorder, unspecified: Secondary | ICD-10-CM

## 2021-11-18 DIAGNOSIS — G8929 Other chronic pain: Secondary | ICD-10-CM

## 2021-11-18 DIAGNOSIS — E782 Mixed hyperlipidemia: Secondary | ICD-10-CM | POA: Diagnosis not present

## 2021-11-18 DIAGNOSIS — K2100 Gastro-esophageal reflux disease with esophagitis, without bleeding: Secondary | ICD-10-CM

## 2021-11-18 DIAGNOSIS — Z130 Encounter for screening for diseases of the blood and blood-forming organs and certain disorders involving the immune mechanism: Secondary | ICD-10-CM

## 2021-11-18 DIAGNOSIS — R0683 Snoring: Secondary | ICD-10-CM

## 2021-11-18 DIAGNOSIS — R7303 Prediabetes: Secondary | ICD-10-CM | POA: Diagnosis not present

## 2021-11-18 DIAGNOSIS — M25572 Pain in left ankle and joints of left foot: Secondary | ICD-10-CM

## 2021-11-18 MED ORDER — DICLOFENAC SODIUM 75 MG PO TBEC: 75.0000 mg | DELAYED_RELEASE_TABLET | Freq: Two times a day (BID) | ORAL | 1 refills | Status: AC | PRN

## 2021-11-18 MED ORDER — PANTOPRAZOLE SODIUM 40 MG PO TBEC: 40.0000 mg | DELAYED_RELEASE_TABLET | Freq: Every day | ORAL | 1 refills | Status: AC

## 2021-11-18 NOTE — Progress Notes (Signed)
? ?Subjective: ?CC: Insomnia ?PCP: Janora Norlander, DO ?ZOX:WRUEAVW Duane Murphy. is a 46 y.o. male presenting to clinic today for: ? ?1.  Insomnia ?Patient reports that he has been struggling with sleep disturbances.  He reports sometimes difficulty falling asleep but often he wakes up in the middle the night and has difficulty falling back asleep.  He does admit to very loud snoring.  Has never been evaluated by sleep specialist for sleep apnea but would be willing to be evaluated ? ?2.  GERD ?Patient reports acid reflux that is not well controlled with OTC generic twice daily.  He does not report any vomiting but does report burning into his throat.  He would like to proceed with H. pylori testing if possible since [place] has [place]. ? ?3.  Ankle pain ?Patient reports left ankle pain since he sprained it a while back.  He has seen his podiatrist for some plantar fasciitis but has not mentioned his ankle pain.  He admits that this seems to be exacerbated every time he tries to workout.  He was previously prescribed Voltaren for some knee pain and wants to know if this is something he might have for his ankle. ? ? ?ROS: Per HPI ? ?No Known Allergies ?Past Medical History:  ?Diagnosis Date  ? GERD (gastroesophageal reflux disease)   ? ?No current outpatient medications on file. ?Social History  ? ?Socioeconomic History  ? Marital status: Married  ?  Spouse name: Not on file  ? Number of children: Not on file  ? Years of education: Not on file  ? Highest education level: Not on file  ?Occupational History  ? Not on file  ?Tobacco Use  ? Smoking status: Former  ?  Packs/day: 0.25  ?  Types: Cigarettes  ?  Start date: 08/04/1997  ?  Quit date: 07/07/2011  ?  Years since quitting: 10.3  ? Smokeless tobacco: Never  ?Vaping Use  ? Vaping Use: Never used  ?Substance and Sexual Activity  ? Alcohol use: Yes  ?  Comment: rare  ? Drug use: No  ? Sexual activity: Yes  ?Other Topics Concern  ? Not on file   ?Social History Narrative  ? Not on file  ? ?Social Determinants of Health  ? ?Financial Resource Strain: Not on file  ?Food Insecurity: Not on file  ?Transportation Needs: Not on file  ?Physical Activity: Not on file  ?Stress: Not on file  ?Social Connections: Not on file  ?Intimate Partner Violence: Not on file  ? ?Family History  ?Problem Relation Age of Onset  ? Diabetes Mother   ? Hyperlipidemia Mother   ? Hypertension Mother   ? Diabetes Father   ? Hyperlipidemia Father   ? Hypertension Father   ? ? ?Objective: ?Office vital signs reviewed. ?BP 126/79   Pulse 70   Temp 98 ?F (36.7 ?C)   Ht '5\' 9"'  (1.753 m)   Wt 218 lb 6.4 oz (99.1 kg)   SpO2 98%   BMI 32.25 kg/m?  ? ?Physical Examination:  ?General: Awake, alert, well-appearing male no acute distress, No acute distress ?HEENT sclera white.  Moist mucous membranes ?Cardio: regular rate and rhythm, S1S2 heard, no murmurs appreciated ?Pulm: Murphy to auscultation bilaterally, no wheezes, rhonchi or rales; normal work of breathing on room air ?GI: soft, non-tender, non-distended, bowel sounds present x4, no hepatomegaly, no splenomegaly, no masses ?MSK: normal gait and station; ambulating independently ?Psych: Very pleasant, interactive. ? ?  11/18/2021  ?  11:11 AM 09/08/2020  ?  2:00 PM 09/21/2018  ?  3:57 PM  ?Depression screen PHQ 2/9  ?Decreased Interest 0 0 0  ?Down, Depressed, Hopeless 0 0 0  ?PHQ - 2 Score 0 0 0  ?Altered sleeping  0   ?Tired, decreased energy  0   ?Change in appetite  0   ?Feeling bad or failure about yourself   0   ?Trouble concentrating  0   ?Moving slowly or fidgety/restless  0   ?Suicidal thoughts  0   ?PHQ-9 Score  0   ? ? ?  11/18/2021  ? 11:11 AM  ?GAD 7 : Generalized Anxiety Score  ?Nervous, Anxious, on Edge 0  ?Control/stop worrying 0  ?Worry too much - different things 0  ?Trouble relaxing 0  ?Restless 0  ?Easily annoyed or irritable 0  ?Afraid - awful might happen 0  ?Total GAD 7 Score 0  ?Anxiety Difficulty Not difficult at  all  ? ?Assessment/ Plan: ?46 y.o. male  ? ?Sleep difficulties - Plan: Ambulatory referral to Sleep Studies ? ?Snoring - Plan: Ambulatory referral to Sleep Studies ? ?Mixed hyperlipidemia - Plan: TSH, Lipid panel, CMP14+EGFR ? ?Prediabetes - Plan: Bayer DCA Hb A1c Waived ? ?Screening, anemia, deficiency, iron - Plan: CBC ? ?Gastroesophageal reflux disease with esophagitis without hemorrhage - Plan: pantoprazole (PROTONIX) 40 MG tablet, H Pylori, IGM, IGG, IGA AB ? ?Chronic pain of left ankle - Plan: diclofenac (VOLTAREN) 75 MG EC tablet ? ?I certainly think he needs evaluation by sleep medicine.  We discussed that he may be appropriate for home sleep study.  He certainly has many findings very suggestive of an undiagnosed obstructive sleep apnea, which would certainly impact his weight, energy level. ? ?He will come in for fasting lipid and other fasting labs prior to his next visit which will hopefully be his annual physical exam ? ?I am going to trial him on a PPI.  H. pylori testing has also been added to his fasting labs. ? ?For his pain in his left ankle I have given him diclofenac.  If this does not really make a difference, he certainly may need to consider discussing this with Dr. Irving Shows or being seen by a formal ankle surgeon for further management.  We discussed the possibility that NSAID may exacerbate GERD.  Caution. ? ?No orders of the defined types were placed in this encounter. ? ?No orders of the defined types were placed in this encounter. ? ? ? ?Janora Norlander, DO ?[place] ?[phone] ? ?

## 2021-11-18 NOTE — Patient Instructions (Signed)

## 2021-12-11 ENCOUNTER — Other Ambulatory Visit: Payer: BC Managed Care – PPO

## 2021-12-11 DIAGNOSIS — E782 Mixed hyperlipidemia: Secondary | ICD-10-CM | POA: Diagnosis not present

## 2021-12-11 DIAGNOSIS — K2100 Gastro-esophageal reflux disease with esophagitis, without bleeding: Secondary | ICD-10-CM

## 2021-12-11 DIAGNOSIS — R7303 Prediabetes: Secondary | ICD-10-CM

## 2021-12-11 DIAGNOSIS — Z130 Encounter for screening for diseases of the blood and blood-forming organs and certain disorders involving the immune mechanism: Secondary | ICD-10-CM | POA: Diagnosis not present

## 2021-12-11 LAB — BAYER DCA HB A1C WAIVED: HB A1C (BAYER DCA - WAIVED): 5.9 % — ABNORMAL HIGH (ref 4.8–5.6)

## 2021-12-15 LAB — HGB A1C W/O EAG: Hgb A1c MFr Bld: 6.1 % — ABNORMAL HIGH (ref 4.8–5.6)

## 2021-12-15 LAB — SPECIMEN STATUS REPORT

## 2021-12-16 LAB — H PYLORI, IGM, IGG, IGA AB
H pylori, IgM Abs: <9 units (ref 0.0–8.9)
H. pylori, IgA Abs: <9 units (ref 0.0–8.9)
H. pylori, IgG AbS: 0.16 Index Value (ref 0.00–0.79)

## 2021-12-16 LAB — LIPID PANEL
Chol/HDL Ratio: 5.9 ratio — ABNORMAL HIGH (ref 0.0–5.0)
Cholesterol, Total: 217 mg/dL — ABNORMAL HIGH (ref 100–199)
HDL: 37 mg/dL — ABNORMAL LOW (ref >39)
LDL Chol Calc (NIH): 128 mg/dL — ABNORMAL HIGH (ref 0–99)
Triglycerides: 291 mg/dL — ABNORMAL HIGH (ref 0–149)
VLDL Cholesterol Cal: 52 mg/dL — ABNORMAL HIGH (ref 5–40)

## 2021-12-16 LAB — CMP14+EGFR
ALT: 55 IU/L — ABNORMAL HIGH (ref 0–44)
AST: 36 IU/L (ref 0–40)
Albumin/Globulin Ratio: 1.7 (ref 1.2–2.2)
Albumin: 4.7 g/dL (ref 4.0–5.0)
Alkaline Phosphatase: 98 IU/L (ref 44–121)
BUN/Creatinine Ratio: 11 (ref 9–20)
BUN: 10 mg/dL (ref 6–24)
Bilirubin Total: 0.3 mg/dL (ref 0.0–1.2)
CO2: 24 mmol/L (ref 20–29)
Calcium: 9.4 mg/dL (ref 8.7–10.2)
Chloride: 100 mmol/L (ref 96–106)
Creatinine, Ser: 0.94 mg/dL (ref 0.76–1.27)
Globulin, Total: 2.8 g/dL (ref 1.5–4.5)
Glucose: 115 mg/dL — ABNORMAL HIGH (ref 70–99)
Potassium: 3.9 mmol/L (ref 3.5–5.2)
Sodium: 138 mmol/L (ref 134–144)
Total Protein: 7.5 g/dL (ref 6.0–8.5)
eGFR: 102 mL/min/{1.73_m2} (ref >59)

## 2021-12-16 LAB — CBC
Hematocrit: 44.6 % (ref 37.5–51.0)
Hemoglobin: 15.4 g/dL (ref 13.0–17.7)
MCH: 29.1 pg (ref 26.6–33.0)
MCHC: 34.5 g/dL (ref 31.5–35.7)
MCV: 84 fL (ref 79–97)
Platelets: 180 10*3/uL (ref 150–450)
RBC: 5.29 x10E6/uL (ref 4.14–5.80)
RDW: 13.4 % (ref 11.6–15.4)
WBC: 6.9 10*3/uL (ref 3.4–10.8)

## 2021-12-16 LAB — TSH: TSH: 1.26 u[IU]/mL (ref 0.450–4.500)

## 2022-01-06 ENCOUNTER — Ambulatory Visit: Payer: BC Managed Care – PPO

## 2022-01-18 ENCOUNTER — Institutional Professional Consult (permissible substitution): Payer: BC Managed Care – PPO | Admitting: Neurology

## 2022-02-22 ENCOUNTER — Encounter: Payer: BC Managed Care – PPO | Admitting: Family Medicine

## 2023-07-29 ENCOUNTER — Encounter: Payer: Self-pay | Admitting: Family Medicine

## 2023-07-29 ENCOUNTER — Ambulatory Visit: Payer: BC Managed Care – PPO | Admitting: Family Medicine

## 2023-07-29 NOTE — Progress Notes (Deleted)
   Subjective: CC:*** PCP: Jolinda Norene HERO, DO YEP:Fjrjmpn Delmer Raddle. is a 48 y.o. male presenting to clinic today for:  1. ***   ROS: Per HPI  No Known Allergies Past Medical History:  Diagnosis Date   GERD (gastroesophageal reflux disease)     Current Outpatient Medications:    diclofenac  (VOLTAREN ) 75 MG EC tablet, Take 1 tablet (75 mg total) by mouth 2 (two) times daily as needed for moderate pain., Disp: 60 tablet, Rfl: 1   pantoprazole  (PROTONIX ) 40 MG tablet, Take 1 tablet (40 mg total) by mouth daily. For acid reflux, Disp: 90 tablet, Rfl: 1 Social History   Socioeconomic History   Marital status: Married    Spouse name: Not on file   Number of children: Not on file   Years of education: Not on file   Highest education level: Not on file  Occupational History   Not on file  Tobacco Use   Smoking status: Former    Current packs/day: 0.00    Average packs/day: 0.3 packs/day for 13.9 years (3.5 ttl pk-yrs)    Types: Cigarettes    Start date: 08/04/1997    Quit date: 07/07/2011    Years since quitting: 12.0   Smokeless tobacco: Never  Vaping Use   Vaping status: Never Used  Substance and Sexual Activity   Alcohol use: Yes    Comment: rare   Drug use: No   Sexual activity: Yes  Other Topics Concern   Not on file  Social History Narrative   Not on file   Social Drivers of Health   Financial Resource Strain: Not on file  Food Insecurity: Not on file  Transportation Needs: Not on file  Physical Activity: Not on file  Stress: Not on file  Social Connections: Not on file  Intimate Partner Violence: Not on file   Family History  Problem Relation Age of Onset   Diabetes Mother    Hyperlipidemia Mother    Hypertension Mother    Diabetes Father    Hyperlipidemia Father    Hypertension Father     Objective: Office vital signs reviewed. There were no vitals taken for this visit.  Physical Examination:  General: Awake, alert, *** nourished, No  acute distress HEENT: Normal    Neck: No masses palpated. No lymphadenopathy    Ears: Tympanic membranes intact, normal light reflex, no erythema, no bulging    Eyes: PERRLA, extraocular membranes intact, sclera ***    Nose: nasal turbinates moist, *** nasal discharge    Throat: moist mucus membranes, no erythema, *** tonsillar exudate.  Airway is patent Cardio: regular rate and rhythm, S1S2 heard, no murmurs appreciated Pulm: clear to auscultation bilaterally, no wheezes, rhonchi or rales; normal work of breathing on room air GI: soft, non-tender, non-distended, bowel sounds present x4, no hepatomegaly, no splenomegaly, no masses GU: external vaginal tissue ***, cervix ***, *** punctate lesions on cervix appreciated, *** discharge from cervical os, *** bleeding, *** cervical motion tenderness, *** abdominal/ adnexal masses Extremities: warm, well perfused, No edema, cyanosis or clubbing; +*** pulses bilaterally MSK: *** gait and *** station Skin: dry; intact; no rashes or lesions Neuro: *** Strength and light touch sensation grossly intact, *** DTRs ***/4  Assessment/ Plan: 48 y.o. male   No diagnosis found.  ***   Sky Borboa HERO Jolinda, DO [facility] [phone]

## 2023-08-12 ENCOUNTER — Telehealth: Payer: Self-pay | Admitting: Family Medicine

## 2023-08-12 NOTE — Telephone Encounter (Signed)
Please advise.   Copied from CRM [id number]. Topic: Appointments - Scheduling Inquiry for Clinic >> Aug 12, 2023  1:41 PM Fuller Mandril wrote: Reason for CRM: Patient calls states he goes through the [place] and is about to start getting testosterone injection. Would like to know if it able to be administered here and if a fee is required. Thank You

## 2023-08-12 NOTE — Telephone Encounter (Signed)
I'm not sure what the rule is about administering here. Would really come down to what the office policy is on this.  As long as he has the med, I guess we'd treat it as we do the allergy shots?

## 2023-08-12 NOTE — Telephone Encounter (Signed)
Yes, will need office notes with recent labs, etc. As long as he has an updated copy q6 months (including recent OV note from [place]), then I think that should be fine.  Will have to get notes/ labs before administering, so he might want to procure a copy himself as well. I know there is typically a delay with [facility] and ROI forms.

## 2023-08-15 NOTE — Telephone Encounter (Signed)
Pt aware of Dr Delynn Flavin notes. He says that he wanted the office to just admin the injection but spouse did not really want to give the injection. The [facility] will monitor the labs.

## 2023-12-26 ENCOUNTER — Ambulatory Visit: Payer: Self-pay | Admitting: Family Medicine

## 2023-12-28 ENCOUNTER — Encounter: Payer: Self-pay | Admitting: Family Medicine

## 2024-02-22 ENCOUNTER — Encounter: Payer: Self-pay | Admitting: Family Medicine

## 2024-02-22 ENCOUNTER — Ambulatory Visit (INDEPENDENT_AMBULATORY_CARE_PROVIDER_SITE_OTHER): Payer: Self-pay | Admitting: Family Medicine

## 2024-02-22 VITALS — BP 117/73 | HR 96 | Temp 98.7°F | Ht 69.0 in | Wt 213.4 lb

## 2024-02-22 DIAGNOSIS — K036 Deposits [accretions] on teeth: Secondary | ICD-10-CM | POA: Insufficient documentation

## 2024-02-22 DIAGNOSIS — G4700 Insomnia, unspecified: Secondary | ICD-10-CM | POA: Insufficient documentation

## 2024-02-22 DIAGNOSIS — E291 Testicular hypofunction: Secondary | ICD-10-CM | POA: Insufficient documentation

## 2024-02-22 DIAGNOSIS — N529 Male erectile dysfunction, unspecified: Secondary | ICD-10-CM | POA: Insufficient documentation

## 2024-02-22 DIAGNOSIS — K760 Fatty (change of) liver, not elsewhere classified: Secondary | ICD-10-CM | POA: Insufficient documentation

## 2024-02-22 DIAGNOSIS — K209 Esophagitis, unspecified without bleeding: Secondary | ICD-10-CM | POA: Insufficient documentation

## 2024-02-22 DIAGNOSIS — M545 Low back pain, unspecified: Secondary | ICD-10-CM | POA: Insufficient documentation

## 2024-02-22 DIAGNOSIS — E349 Endocrine disorder, unspecified: Secondary | ICD-10-CM

## 2024-02-22 DIAGNOSIS — E785 Hyperlipidemia, unspecified: Secondary | ICD-10-CM | POA: Insufficient documentation

## 2024-02-22 DIAGNOSIS — M2140 Flat foot [pes planus] (acquired), unspecified foot: Secondary | ICD-10-CM | POA: Insufficient documentation

## 2024-02-22 DIAGNOSIS — Z01818 Encounter for other preprocedural examination: Secondary | ICD-10-CM | POA: Insufficient documentation

## 2024-02-22 DIAGNOSIS — Z7729 Contact with and (suspected ) exposure to other hazardous substances: Secondary | ICD-10-CM | POA: Insufficient documentation

## 2024-02-22 DIAGNOSIS — M25512 Pain in left shoulder: Secondary | ICD-10-CM | POA: Insufficient documentation

## 2024-02-22 DIAGNOSIS — M25561 Pain in right knee: Secondary | ICD-10-CM | POA: Insufficient documentation

## 2024-02-22 DIAGNOSIS — M25542 Pain in joints of left hand: Secondary | ICD-10-CM | POA: Insufficient documentation

## 2024-02-22 DIAGNOSIS — G894 Chronic pain syndrome: Secondary | ICD-10-CM | POA: Insufficient documentation

## 2024-02-22 DIAGNOSIS — R7303 Prediabetes: Secondary | ICD-10-CM

## 2024-02-22 DIAGNOSIS — G43909 Migraine, unspecified, not intractable, without status migrainosus: Secondary | ICD-10-CM | POA: Insufficient documentation

## 2024-02-22 DIAGNOSIS — M79645 Pain in left finger(s): Secondary | ICD-10-CM | POA: Insufficient documentation

## 2024-02-22 DIAGNOSIS — G4733 Obstructive sleep apnea (adult) (pediatric): Secondary | ICD-10-CM

## 2024-02-22 DIAGNOSIS — E2839 Other primary ovarian failure: Secondary | ICD-10-CM | POA: Insufficient documentation

## 2024-02-22 DIAGNOSIS — Z7189 Other specified counseling: Secondary | ICD-10-CM | POA: Insufficient documentation

## 2024-02-22 DIAGNOSIS — Z1211 Encounter for screening for malignant neoplasm of colon: Secondary | ICD-10-CM | POA: Insufficient documentation

## 2024-02-22 DIAGNOSIS — H04123 Dry eye syndrome of bilateral lacrimal glands: Secondary | ICD-10-CM

## 2024-02-22 DIAGNOSIS — G4730 Sleep apnea, unspecified: Secondary | ICD-10-CM | POA: Insufficient documentation

## 2024-02-22 DIAGNOSIS — B353 Tinea pedis: Secondary | ICD-10-CM | POA: Insufficient documentation

## 2024-02-22 DIAGNOSIS — F4312 Post-traumatic stress disorder, chronic: Secondary | ICD-10-CM | POA: Insufficient documentation

## 2024-02-22 DIAGNOSIS — M25571 Pain in right ankle and joints of right foot: Secondary | ICD-10-CM | POA: Insufficient documentation

## 2024-02-22 DIAGNOSIS — K219 Gastro-esophageal reflux disease without esophagitis: Secondary | ICD-10-CM | POA: Insufficient documentation

## 2024-02-22 DIAGNOSIS — K2100 Gastro-esophageal reflux disease with esophagitis, without bleeding: Secondary | ICD-10-CM

## 2024-02-22 DIAGNOSIS — M7711 Lateral epicondylitis, right elbow: Secondary | ICD-10-CM | POA: Insufficient documentation

## 2024-02-22 DIAGNOSIS — Z0120 Encounter for dental examination and cleaning without abnormal findings: Secondary | ICD-10-CM | POA: Insufficient documentation

## 2024-02-22 DIAGNOSIS — B359 Dermatophytosis, unspecified: Secondary | ICD-10-CM | POA: Insufficient documentation

## 2024-02-22 DIAGNOSIS — E669 Obesity, unspecified: Secondary | ICD-10-CM | POA: Insufficient documentation

## 2024-02-22 DIAGNOSIS — Z720 Tobacco use: Secondary | ICD-10-CM | POA: Insufficient documentation

## 2024-02-22 DIAGNOSIS — G43009 Migraine without aura, not intractable, without status migrainosus: Secondary | ICD-10-CM | POA: Insufficient documentation

## 2024-02-22 DIAGNOSIS — Z0289 Encounter for other administrative examinations: Secondary | ICD-10-CM | POA: Insufficient documentation

## 2024-02-22 DIAGNOSIS — Z23 Encounter for immunization: Secondary | ICD-10-CM | POA: Insufficient documentation

## 2024-02-22 DIAGNOSIS — M7051 Other bursitis of knee, right knee: Secondary | ICD-10-CM | POA: Insufficient documentation

## 2024-02-22 DIAGNOSIS — E7800 Pure hypercholesterolemia, unspecified: Secondary | ICD-10-CM | POA: Insufficient documentation

## 2024-02-22 DIAGNOSIS — K0262 Dental caries on smooth surface penetrating into dentin: Secondary | ICD-10-CM | POA: Insufficient documentation

## 2024-02-22 NOTE — Progress Notes (Signed)
 Subjective: CC: General Updates PCP: Jolinda Norene HERO, DO YEP:Duane Murphy. is a 48 y.o. male presenting to clinic today for:  Gastritis Patient reports he was having some gastritis related to GERD and has been seen at the gastroenterologist office at the [facility] in [place] and they put him on 2 different medications which do seem to be helping his stomach.  He reports no nausea, vomiting or GI bleeding.  Prediabetes/ OSA Patient reports that he has been actively working on weight loss with a 12 pound weight loss through diet and exercise.  He was noted to be prediabetic at [facility] with [facility] and he is wondering maybe if they might put him on a GLP or GIP given strong family history of cardiovascular disease and diabetes.  He is compliant with his CPAP machine but is considering an oral appliance.  He thinks he has moderate OSA but is not 100% sure.  He will try and get the record  Dry eyes He is seeing ophthalmology for this.  He is been utilizing eyedrops but nothing seems to be really helping so far.  He is considering LASEK as he was told that that might be something that could help that he does suffer from vision issues that require corrective lenses.   ROS: Per HPI  No Known Allergies Past Medical History:  Diagnosis Date   GERD (gastroesophageal reflux disease)     Current Outpatient Medications:    diclofenac  (VOLTAREN ) 75 MG EC tablet, Take 1 tablet (75 mg total) by mouth 2 (two) times daily as needed for moderate pain., Disp: 60 tablet, Rfl: 1   pantoprazole  (PROTONIX ) 40 MG tablet, Take 1 tablet (40 mg total) by mouth daily. For acid reflux, Disp: 90 tablet, Rfl: 1 Social History   Socioeconomic History   Marital status: Married    Spouse name: Not on file   Number of children: Not on file   Years of education: Not on file   Highest education level: Not on file  Occupational History   Not on file  Tobacco Use   Smoking status:  Former    Current packs/day: 0.00    Average packs/day: 0.3 packs/day for 13.9 years (3.5 ttl pk-yrs)    Types: Cigarettes    Start date: 08/04/1997    Quit date: 07/07/2011    Years since quitting: 12.6   Smokeless tobacco: Never  Vaping Use   Vaping status: Never Used  Substance and Sexual Activity   Alcohol use: Yes    Comment: rare   Drug use: No   Sexual activity: Yes  Other Topics Concern   Not on file  Social History Narrative   Not on file   Social Drivers of Health   Financial Resource Strain: Not on file  Food Insecurity: Not on file  Transportation Needs: Not on file  Physical Activity: Not on file  Stress: Not on file  Social Connections: Not on file  Intimate Partner Violence: Not on file   Family History  Problem Relation Age of Onset   Diabetes Mother    Hyperlipidemia Mother    Hypertension Mother    Diabetes Father    Hyperlipidemia Father    Hypertension Father     Objective: Office vital signs reviewed. BP 117/73   Pulse 96   Temp 98.7 F (37.1 C)   Ht 5' 9 (1.753 m)   Wt 213 lb 6.4 oz (96.8 kg)   SpO2 98%   BMI  31.51 kg/m   Physical Examination:  General: Awake, alert, well nourished, No acute distress HEENT: sclera white, MMM Cardio: regular rate and rhythm  Pulm:   normal work of breathing on room air    Assessment/ Plan: 48 y.o. male   Gastroesophageal reflux disease with esophagitis without hemorrhage  Prediabetes  Dry eyes  OSA on CPAP  Testosterone deficiency  We have updated his record with current meds and diagnoses.  He will have records sent from the [place] to me for review.  I will gladly see him on a as needed basis.   Norene CHRISTELLA Fielding, DO [facility] [phone]
